# Patient Record
Sex: Male | Born: 1970 | Race: White | Hispanic: No | Marital: Married | Smoking: Never smoker
Health system: Southern US, Community
[De-identification: ages and names within clinical notes are randomized; demographics above are authoritative.]

## PROBLEM LIST (undated history)

## (undated) DIAGNOSIS — L989 Disorder of the skin and subcutaneous tissue, unspecified: Secondary | ICD-10-CM

## (undated) DIAGNOSIS — B019 Varicella without complication: Secondary | ICD-10-CM

## (undated) DIAGNOSIS — Z8619 Personal history of other infectious and parasitic diseases: Secondary | ICD-10-CM

## (undated) DIAGNOSIS — R55 Syncope and collapse: Secondary | ICD-10-CM

## (undated) DIAGNOSIS — E781 Pure hyperglyceridemia: Secondary | ICD-10-CM

## (undated) DIAGNOSIS — T7840XA Allergy, unspecified, initial encounter: Secondary | ICD-10-CM

## (undated) HISTORY — PX: SHOULDER ARTHROSCOPY W/ ROTATOR CUFF REPAIR: SHX2400

## (undated) HISTORY — DX: Varicella without complication: B01.9

## (undated) HISTORY — DX: Syncope and collapse: R55

## (undated) HISTORY — DX: Pure hyperglyceridemia: E78.1

## (undated) HISTORY — PX: CHOLECYSTECTOMY: SHX55

## (undated) HISTORY — DX: Personal history of other infectious and parasitic diseases: Z86.19

## (undated) HISTORY — PX: APPENDECTOMY: SHX54

## (undated) HISTORY — DX: Allergy, unspecified, initial encounter: T78.40XA

## (undated) HISTORY — DX: Disorder of the skin and subcutaneous tissue, unspecified: L98.9

## (undated) HISTORY — PX: EYE SURGERY: SHX253

---

## 2007-04-10 ENCOUNTER — Encounter: Admission: RE | Admit: 2007-04-10 | Discharge: 2007-04-10 | Payer: Self-pay | Admitting: Orthopedic Surgery

## 2007-06-02 ENCOUNTER — Encounter: Admission: RE | Admit: 2007-06-02 | Discharge: 2007-06-02 | Payer: Self-pay | Admitting: Internal Medicine

## 2010-12-30 ENCOUNTER — Encounter: Payer: Self-pay | Admitting: Orthopedic Surgery

## 2011-04-18 ENCOUNTER — Encounter: Payer: Self-pay | Admitting: Internal Medicine

## 2011-04-18 ENCOUNTER — Ambulatory Visit (INDEPENDENT_AMBULATORY_CARE_PROVIDER_SITE_OTHER): Payer: PRIVATE HEALTH INSURANCE | Admitting: Internal Medicine

## 2011-04-18 DIAGNOSIS — Z0000 Encounter for general adult medical examination without abnormal findings: Secondary | ICD-10-CM

## 2011-04-18 DIAGNOSIS — E785 Hyperlipidemia, unspecified: Secondary | ICD-10-CM

## 2011-04-18 NOTE — Progress Notes (Signed)
Subjective:    Patient ID: Samuel Bridges, male    DOB: [birth year], 40 y.o.   MRN: [id number]  HPI Mr. Samuel Bridges presents to establish for on-going continuity care. His chief concern is a history of elevated transaminase that would resolve as he reduced his heavy exercise program. It went up again with working out. He has had no liver disease previously and has no particular risk factors. He was consuming a very high protein diet, including protein supplements. He also had an elevated lipid panel that was uncharacteristic: total cholesterol of 281, triglycerides in the 400's. This was an unusual lab reading for him: he has no prior h/o elevated cholesterol.  Past Medical History  Diagnosis Date  . Chicken pox   . History of shingles   . Allergy   . Vaso vagal episode     had full cardiology evaluation for exertional symptoms: negative Echo, stress-test  . Skin lesions, generalized     close surveillance with family history of melanoma   Past Surgical History  Procedure Date  . Appendectomy     '97  . Cholecystectomy     '10 acute; laproscopic  . Shoulder arthroscopy w/ rotator cuff repair     right shoulde  2010  . Eye surgery     correction of stabismus - adjustable suture technique   Family History  Problem Relation Age of Onset  . Hypertension Mother   . Cancer Father     lung  . Basal cell carcinoma Father     aggressive - required radical neck dissection  . Melanoma Sister   . Basal cell carcinoma Sister   . Cancer Paternal Grandfather     lung cancer  . Diabetes Neg Hx   . Early death Neg Hx   . COPD Neg Hx   . Cancer Other     lung cancer   History   Social History  . Marital Status: Married    Spouse Name: N/A    Number of Children: 3  . Years of Education: 16   Occupational History  . businessman     Gen' mgr for [employer]   Social History Main Topics  . Smoking status: Never Smoker   . Smokeless tobacco: Never Used  . Alcohol Use: Yes  . Drug  Use: No  . Sexually Active: Yes -- Male partner(s)   Other Topics Concern  . Not on file   Social History Narrative   [employer] BS ac[employer]g; Married '02.  2 sons - '03, '06; 1 dtr - '09. 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 Marriage is in excellent health. Hobbies - adult baseball league - plays catcher; physical training       Review of Systems Review of Systems  Constitutional:  Negative for fever, chills, activity change and unexpected weight change.  HENT:  Negative for hearing loss, ear pain, congestion, neck stiffness and postnasal drip.   Eyes: Negative for pain, discharge and visual disturbance.  Respiratory: Negative for chest tightness and wheezing.   Cardiovascular: Negative for chest pain and palpitations.       [No decreased exercise tolerance Gastrointestinal: [No change in bowel habit. No bloating or gas. No reflux or indigestion Genitourinary: Negative for urgency, frequency, flank pain and difficulty urinating.  Musculoskeletal: Negative for myalgias, back pain, arthralgias and gait problem.  Neurological: Negative for dizziness, tremors, weakness and headaches.  Hematological: Negative for adenopathy.  Psychiatric/Behavioral: Negative for behavioral problems and dysphoric  mood.       Objective:   Physical Exam Constitutional: He is oriented to person, place, and time. He appears well-developed and well-nourished.       Healthy appearing white male in no acute distress  HENT:  Head: Normocephalic and atraumatic.  Right Ear: External ear normal. EAC/TM nl Left Ear: External ear normal.  EAC/TM nl Nose: Nose normal.  Mouth/Throat: Oropharynx is clear and moist.  Eyes: Conjunctivae and EOM are normal. Pupils are equal, round, and reactive to light. Right eye exhibits no discharge. Left eye exhibits no discharge. No scleral icterus.  Neck: Normal range of motion. Neck supple. No JVD present. No tracheal deviation present. No  thyromegaly present.  Cardiovascular: Normal rate, regular rhythm and normal heart sounds.  Exam reveals no gallop and no friction rub.   No murmur heard.      Quiet precordium. 2+ radial and DP pulses  Pulmonary/Chest: Effort normal. No respiratory distress. He has no wheezes. He has no rales. He exhibits no tenderness.       No chest wall deformity  Abdominal: Soft. Bowel sounds are normal. He exhibits no distension. There is no tenderness. There is no rebound and no guarding.       No heptosplenomegaly, no tenderness rightupper quadrant  Genitourinary: Deferred  Musculoskeletal: Normal range of motion. He exhibits no edema and no tenderness.       Small and large joints without redness, synovial thickening or deformity. Full range of motion preserved about all small, median and large joints.  Lymphadenopathy:    He has no cervical adenopathy.  Neurological: He is alert and oriented to person, place, and time. He has normal reflexes. No cranial nerve deficit. Coordination normal.  Skin: Skin is warm and dry. No rash noted. No erythema. Benign lesions on back but no suspicious lesions Psychiatric: He has a normal mood and affect. His behavior is normal. Thought content normal.         Assessment & Plan:  1. Elevated transaminases - LFT will fluctuate. He does not know if this is SGOT or SGPT. If the former, given the association with extreme training, it may be an elevation due to muscle inflammation. He does report that he has been a blood donor with normal iron levels arguing against hemachromatosis  Plan - U/S abdomen to asses hepatic architecture           Lab: LFTs, GGT, alpha-1-antitrypsin  2. Elevated lipids - he reports that his diet had been fat laden prior to the last study.  Plan - repeat Lipid panel with recommendations to follow.  3. Health maintenance - patient with benign history. PHysical exam is normal. Labs are pending. He is physically active and fit. His  immunizations are up to date.   In summary - a very nice man who appears medically stable. Will fully evaluated liver function and lipids. If all testing is normal he is asked to return on a as needed basis or 2 years.

## 2011-04-19 ENCOUNTER — Encounter: Payer: Self-pay | Admitting: Internal Medicine

## 2011-04-19 DIAGNOSIS — E785 Hyperlipidemia, unspecified: Secondary | ICD-10-CM | POA: Insufficient documentation

## 2011-04-24 ENCOUNTER — Telehealth: Payer: Self-pay | Admitting: Internal Medicine

## 2011-04-24 ENCOUNTER — Ambulatory Visit
Admission: RE | Admit: 2011-04-24 | Discharge: 2011-04-24 | Disposition: A | Discharge status: Home / Self Care | Payer: PRIVATE HEALTH INSURANCE | Source: Ambulatory Visit | Attending: Internal Medicine | Admitting: Internal Medicine

## 2011-04-24 NOTE — Telephone Encounter (Signed)
Patient informed. 

## 2011-04-24 NOTE — Telephone Encounter (Signed)
Please call pt: abdominal U/S 100% normal except somebody snuck off with the gallbladder. Thanks

## 2011-05-28 ENCOUNTER — Other Ambulatory Visit (INDEPENDENT_AMBULATORY_CARE_PROVIDER_SITE_OTHER): Payer: PRIVATE HEALTH INSURANCE

## 2011-05-28 ENCOUNTER — Other Ambulatory Visit: Payer: Self-pay | Admitting: Internal Medicine

## 2011-05-28 DIAGNOSIS — Z0000 Encounter for general adult medical examination without abnormal findings: Secondary | ICD-10-CM

## 2011-05-28 DIAGNOSIS — E785 Hyperlipidemia, unspecified: Secondary | ICD-10-CM

## 2011-05-28 LAB — LIPID PANEL: HDL: 45 mg/dL (ref >39.00)

## 2011-05-28 LAB — HEPATIC FUNCTION PANEL
ALT: 36 U/L (ref 0–53)
Alkaline Phosphatase: 84 U/L (ref 39–117)
Bilirubin, Direct: 0.2 mg/dL (ref 0.0–0.3)
Total Protein: 7 g/dL (ref 6.0–8.3)

## 2011-05-28 LAB — COMPREHENSIVE METABOLIC PANEL
AST: 29 U/L (ref 0–37)
Alkaline Phosphatase: 84 U/L (ref 39–117)
BUN: 12 mg/dL (ref 6–23)
Creatinine, Ser: 1.1 mg/dL (ref 0.4–1.5)
Total Bilirubin: 0.8 mg/dL (ref 0.3–1.2)

## 2011-05-30 ENCOUNTER — Encounter: Payer: Self-pay | Admitting: Internal Medicine

## 2011-08-08 ENCOUNTER — Other Ambulatory Visit (INDEPENDENT_AMBULATORY_CARE_PROVIDER_SITE_OTHER): Payer: PRIVATE HEALTH INSURANCE

## 2011-08-08 ENCOUNTER — Ambulatory Visit (INDEPENDENT_AMBULATORY_CARE_PROVIDER_SITE_OTHER): Payer: PRIVATE HEALTH INSURANCE | Admitting: Internal Medicine

## 2011-08-08 DIAGNOSIS — R202 Paresthesia of skin: Secondary | ICD-10-CM

## 2011-08-08 DIAGNOSIS — M2550 Pain in unspecified joint: Secondary | ICD-10-CM

## 2011-08-08 DIAGNOSIS — R209 Unspecified disturbances of skin sensation: Secondary | ICD-10-CM

## 2011-08-08 DIAGNOSIS — R232 Flushing: Secondary | ICD-10-CM

## 2011-08-08 LAB — T4, FREE: Free T4: 0.75 ng/dL (ref 0.60–1.60)

## 2011-08-08 LAB — COMPREHENSIVE METABOLIC PANEL
ALT: 54 U/L — ABNORMAL HIGH (ref 0–53)
AST: 33 U/L (ref 0–37)
Albumin: 4.2 g/dL (ref 3.5–5.2)
Alkaline Phosphatase: 100 U/L (ref 39–117)
BUN: 13 mg/dL (ref 6–23)
Calcium: 9.2 mg/dL (ref 8.4–10.5)
Chloride: 105 mEq/L (ref 96–112)
Potassium: 3.9 mEq/L (ref 3.5–5.1)
Sodium: 143 mEq/L (ref 135–145)
Total Protein: 6.9 g/dL (ref 6.0–8.3)

## 2011-08-08 LAB — HEPATIC FUNCTION PANEL
Albumin: 4.2 g/dL (ref 3.5–5.2)
Total Protein: 6.9 g/dL (ref 6.0–8.3)

## 2011-08-08 NOTE — Progress Notes (Signed)
Subjective:    Patient ID: Samuel Bridges, male    DOB: [birth year], 40 y.o.   MRN: [id number]  HPI Samuel Bridges presents w/ 3 week h/o paresthesias both hands and the left distal LE (minimal symptoms right LE). He has had joint pain at the MCP and PIP joints, no redness, minimal edema. Has a decrease in grip strength that is minimal. He also has been having hot flashes that is a rising sensation but persists for hours. He can't get cool. No tachycardia or SOB. Medium sized joint don't hurt but feel stiff. He denies any respiratory problems, chest pain, GI symptoms. He has not been tachycardic, no change in skin texture. He has not had "fight or flight" type sensation to suggest surge of catecholamines. He was previously feeling well.  Past Medical History  Diagnosis Date  . Chicken pox   . History of shingles   . Allergy   . Vaso vagal episode     had full cardiology evaluation for exertional symptoms: negative Echo, stress-test  . Skin lesions, generalized     close surveillance with family history of melanoma   Past Surgical History  Procedure Date  . Appendectomy     '97  . Cholecystectomy     '10 acute; laproscopic  . Shoulder arthroscopy w/ rotator cuff repair     right shoulde  2010  . Eye surgery     correction of stabismus - adjustable suture technique   Family History  Problem Relation Age of Onset  . Hypertension Mother   . Cancer Father     lung  . Basal cell carcinoma Father     aggressive - required radical neck dissection  . Melanoma Sister   . Basal cell carcinoma Sister   . Cancer Paternal Grandfather     lung cancer  . Diabetes Neg Hx   . Early death Neg Hx   . COPD Neg Hx   . Cancer Other     lung cancer   History   Social History  . Marital Status: Married    Spouse Name: N/A    Number of Children: 3  . Years of Education: 16   Occupational History  . businessman     Gen' mgr for [employer]   Social History Main Topics  . Smoking status:  Never Smoker   . Smokeless tobacco: Never Used  . Alcohol Use: Yes  . Drug Use: No  . Sexually Active: Yes -- Male partner(s)   Other Topics Concern  . Not on file   Social History Narrative   [employer] ac[employer]; Married '02.  2 sons - '03, '06; 1 dtr - '09. 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 Marriage is in excellent health. Hobbies - adult baseball league - plays catcher; physical training       Review of Systems Review of Systems  Constitutional:  Negative for fever, chills, activity change and unexpected weight change.  HEENT:  Negative for hearing loss, ear pain, congestion, neck stiffness and postnasal drip. Negative for sore throat or swallowing problems. Negative for dental complaints.   Eyes: Negative for vision loss or change in visual acuity.  Respiratory: Negative for chest tightness and wheezing.   Cardiovascular: Negative for chest pain and palpitation. No decreased exercise tolerance Gastrointestinal: No change in bowel habit. No bloating or gas. No reflux or indigestion Genitourinary: Negative for urgency, frequency, flank pain and difficulty urinating.  Musculoskeletal: Negative for myalgias,  back pain, arthralgias and gait problem.  Neurological: Negative for dizziness, tremors, weakness and headaches.  Hematological: Negative for adenopathy.  Psychiatric/Behavioral: Negative for behavioral problems and dysphoric mood.       Objective:   Physical Exam Vitals reviewed - stable, no tachycardia Gen'l - WNWD white man in no distress HEENT - C&S clear, no oral lesions Neck- supple, no thyromegaly , no thyroid nodules or tenderness Chest - clear to A&P, no increase WOB Cor- 2+ radial pulse, RRR, no murmurs Abdomen - BS+, soft, no HSM Ext - no deformity, no edema Derm - normal skin turgor, no tenting Neuro - A&O x 3, CN II-XII normal, MS no perceptible weakness, DTRs brisk without clonus, cerebellar - no increased tonicity, no  tremor, nl gait.          Assessment & Plan:  Paresthesias - patient with a combination of paresthesia, mild muscle weakness, joint pain with minimally abnormal physical exam, no sign of infection. Differential includes thyroid disease, possible adrenal dysfunction, connective tissue disease (lupus, myositis.).  Plan - multiple lab studies: thyroid functions, RF, ANA, ESR  Addendum - Bmet - normal, ALT mildly elevated at 54, ESR 8, ANA neg, RF <10, TSH 1.78, FT4 0.75                      B12 low at 203  Plan - B12 replacement           Blood for H.Pylori antibody

## 2011-08-09 LAB — ANA: Anti Nuclear Antibody(ANA): NEGATIVE

## 2011-08-09 LAB — RHEUMATOID FACTOR: Rhuematoid fact SerPl-aCnc: <10 IU/mL (ref <=14)

## 2011-08-16 ENCOUNTER — Telehealth: Payer: Self-pay | Admitting: *Deleted

## 2011-08-16 ENCOUNTER — Ambulatory Visit (INDEPENDENT_AMBULATORY_CARE_PROVIDER_SITE_OTHER): Payer: PRIVATE HEALTH INSURANCE | Admitting: *Deleted

## 2011-08-16 DIAGNOSIS — E538 Deficiency of other specified B group vitamins: Secondary | ICD-10-CM

## 2011-08-16 DIAGNOSIS — R232 Flushing: Secondary | ICD-10-CM

## 2011-08-16 MED ORDER — CYANOCOBALAMIN 1000 MCG/ML IJ SOLN
1000.0000 ug | Freq: Once | INTRAMUSCULAR | Status: AC
  Administered 2011-08-16: 1000 ug via INTRAMUSCULAR

## 2011-08-16 MED ORDER — CYANOCOBALAMIN 1000 MCG/ML IJ SOLN: 1000.0000 ug | Freq: Once | INTRAMUSCULAR | Status: DC

## 2011-08-16 MED ORDER — CYANOCOBALAMIN 1000 MCG/ML IJ SOLN: 1000.0000 ug | INTRAMUSCULAR | Status: DC

## 2011-08-16 NOTE — Progress Notes (Signed)
Explained self injection technique and sent in RX.

## 2011-08-16 NOTE — Telephone Encounter (Signed)
Patient informed, he will come in for nurse visit to learn to self-inject.

## 2011-08-16 NOTE — Patient Instructions (Addendum)
Inject 1 (one) mL of B-12 subcutaneously every 2 weeks x 4 injections then 1 (one) mL once a month x 6 injections. Return for labs in 8 months.

## 2011-08-16 NOTE — Telephone Encounter (Signed)
Patient requesting results of labs. I see OV notes, how often & for how long does he need b12 injections? Also, it says labs for H.pylori but no orders were entered. He needs to come in for additional labs?

## 2011-08-16 NOTE — Telephone Encounter (Signed)
All labs were normal except B12 low at 203.   Plan - B12 shots 1000 mcg q 2 weeks x 4 then monthly x 6 then repeat lab.            Serum h. Pylori ab ordered, also fasting AM cortisol-ordered  Next step if symptoms persist endocrine consult

## 2011-08-20 ENCOUNTER — Other Ambulatory Visit (INDEPENDENT_AMBULATORY_CARE_PROVIDER_SITE_OTHER): Payer: PRIVATE HEALTH INSURANCE

## 2011-08-20 ENCOUNTER — Telehealth: Payer: Self-pay | Admitting: *Deleted

## 2011-08-20 DIAGNOSIS — R1013 Epigastric pain: Secondary | ICD-10-CM

## 2011-08-20 DIAGNOSIS — E538 Deficiency of other specified B group vitamins: Secondary | ICD-10-CM

## 2011-08-20 DIAGNOSIS — R232 Flushing: Secondary | ICD-10-CM

## 2011-08-20 LAB — CORTISOL: Cortisol, Plasma: 7.6 ug/dL

## 2011-08-20 NOTE — Telephone Encounter (Signed)
Lab order needed

## 2011-08-21 ENCOUNTER — Telehealth: Payer: Self-pay | Admitting: *Deleted

## 2011-08-21 NOTE — Telephone Encounter (Signed)
Spoke w/patient. He c/o continued fatigue, ? Fever, body aches and a second "bite" on his neck. This second bite looks like first one that he had treated 6 weeks ago by UC who dx that it was a spider bite and gave him an antibiotic. This new "bite" looks the same but has no symptoms. I had advised him yesterday to keep it clean and dry and come in w/continued symptoms.   He is scheduled for OV tomorrow at 11:30, do you want pt to have any labs prior to OV?

## 2011-08-21 NOTE — Telephone Encounter (Signed)
No labs.  thanks

## 2011-08-22 ENCOUNTER — Ambulatory Visit (INDEPENDENT_AMBULATORY_CARE_PROVIDER_SITE_OTHER): Payer: PRIVATE HEALTH INSURANCE | Admitting: Internal Medicine

## 2011-08-22 VITALS — BP 120/80 | HR 67 | Temp 98.3°F | Wt 218.0 lb

## 2011-08-22 DIAGNOSIS — R232 Flushing: Secondary | ICD-10-CM

## 2011-08-22 LAB — HELICOBACTER PYLORI  ANTIBODY, IGM: Helicobacter pylori, IgM: 2.1 U/mL (ref <9.0)

## 2011-08-22 MED ORDER — AZITHROMYCIN 500 MG PO TABS: 500.0000 mg | ORAL_TABLET | Freq: Every day | ORAL | Status: AC

## 2011-08-22 MED ORDER — DOXYCYCLINE HYCLATE 100 MG PO TABS: 100.0000 mg | ORAL_TABLET | Freq: Two times a day (BID) | ORAL | Status: AC

## 2011-08-22 NOTE — Patient Instructions (Signed)
Neck wound - a bite? Plan - warm compresses two or three times a day; doxycycline 100 mg twice a day for 10 days to cover for skin infection, including MRSA.  Cough and respiratory symptoms - may be mycoplasma. Plan - Azithromycin 500 mg once a day for 3 days = 10- days antibiotic coverage; robitussin DM 1 tsp every 6 hours for the cough..  Flushing - will do 24 hour urine studies to rule out adrenal gland tumor = pheochromocytoma vs a carcinoid turmor. If these studies are normal and the flushing continues will need to refer to an endocrinologist.

## 2011-08-25 DIAGNOSIS — R232 Flushing: Secondary | ICD-10-CM | POA: Insufficient documentation

## 2011-08-25 NOTE — Assessment & Plan Note (Signed)
Patient with continued symptoms. He denies flight-flight feeling but will pursue w/u Pheo  Plan - 24 hr urine studies: catecholamines and metanephrines.

## 2011-08-25 NOTE — Progress Notes (Signed)
  Subjective:    Patient ID: Samuel Bridges, male    DOB: Sep 27, 1971, 40 y.o.   MRN: [id number]  HPI Samuel Bridges opresnets for follow-up. He is being evaluated for flusing and a sense of hyperthermia. Initial labs were unrevealing. He continues to have symptoms w/o change. No new c/o or pporblems.  I have reviewed the patient's medical history in detail and updated the computerized patient record.    Review of Systems System review is negative for any constitutional, cardiac, pulmonary, GI or neuro symptoms or complaints     Objective:   Physical Exam Vitals reviewed - normal BP and pulse Gen'l - WNWD white male in NAD Resp - normal CVor - RRRR       Assessment & Plan:

## 2011-08-26 ENCOUNTER — Other Ambulatory Visit: Payer: PRIVATE HEALTH INSURANCE

## 2011-08-26 DIAGNOSIS — R232 Flushing: Secondary | ICD-10-CM

## 2011-08-29 LAB — 5 HIAA, QUANTITATIVE, URINE, 24 HOUR: 5-HIAA, 24 Hr Urine: 3.3 mg/24 h (ref <=6.0)

## 2011-08-31 LAB — CATECHOLAMINES, FRACTIONATED, URINE, 24 HOUR
Creatinine, Urine mg/day-CATEUR: 2.19 g/(24.h) (ref 0.63–2.50)
Epinephrine, 24 hr Urine: 15 mcg/24 h (ref 2–24)
Total Volume - CF 24Hr U: 1650 mL

## 2011-09-01 LAB — METANEPHRINES, URINE, 24 HOUR
Metaneph Total, Ur: 386 mcg/24 h (ref 115–695)
Normetanephrine, 24H Ur: 288 mcg/24 h (ref 35–482)

## 2011-09-02 ENCOUNTER — Ambulatory Visit (INDEPENDENT_AMBULATORY_CARE_PROVIDER_SITE_OTHER): Payer: PRIVATE HEALTH INSURANCE | Admitting: Internal Medicine

## 2011-09-02 ENCOUNTER — Encounter: Payer: Self-pay | Admitting: Internal Medicine

## 2011-09-02 VITALS — BP 130/88 | HR 64 | Temp 98.0°F | Wt 216.0 lb

## 2011-09-02 DIAGNOSIS — R0789 Other chest pain: Secondary | ICD-10-CM

## 2011-09-02 DIAGNOSIS — R4182 Altered mental status, unspecified: Secondary | ICD-10-CM

## 2011-09-02 NOTE — Progress Notes (Signed)
Subjective:    Patient ID: Samuel Bridges, male    DOB: 02/16/1971, 40 y.o.   MRN: [id number]  HPI Mr. Filsinger has been being evaluated for symptoms of feeling hot intermittently and having flushing episodes. His evaluation has been negative: normal labs, normal 24 hr urine studies for catecholamines, metanephrines, 5HIAA, random AM cortisol. He presents today for follow-up. He reports that over the past 5-7 days he has had intermittent electric shock like sensations posterior occiput and posterior neck, he feels like an internal earthquake occurs periodically, he has generally felt bad, he reports that he has had difficulty expressing himself, his thinking has been slowed. He denies any headache, focal weakness, persistent paresthesia although he does report transient tingling in the extremities. He has had no gait disturbance or focal muscular weakness. He reports that not being able to clearly articulate his symptoms is also very disturbing to him. He does continue with B12 replacement.  Past Medical History  Diagnosis Date  . Chicken pox   . History of shingles   . Allergy   . Vaso vagal episode     had full cardiology evaluation for exertional symptoms: negative Echo, stress-test  . Skin lesions, generalized     close surveillance with family history of melanoma   Past Surgical History  Procedure Date  . Appendectomy     '97  . Cholecystectomy     '10 acute; laproscopic  . Shoulder arthroscopy w/ rotator cuff repair     right shoulde  2010  . Eye surgery     correction of stabismus - adjustable suture technique   Family History  Problem Relation Age of Onset  . Hypertension Mother   . Cancer Father     lung  . Basal cell carcinoma Father     aggressive - required radical neck dissection  . Melanoma Sister   . Basal cell carcinoma Sister   . Cancer Paternal Grandfather     lung cancer  . Diabetes Neg Hx   . Early death Neg Hx   . COPD Neg Hx   . Cancer Other     lung cancer    History   Social History  . Marital Status: Married    Spouse Name: N/A    Number of Children: 3  . Years of Education: 16   Occupational History  . businessman     Gen' mgr for [employer]   Social History Main Topics  . Smoking status: Never Smoker   . Smokeless tobacco: Never Used  . Alcohol Use: Yes  . Drug Use: No  . Sexually Active: Yes -- Male partner(s)   Other Topics Concern  . Not on file   Social History Narrative   [employer] BS ac[employer]; Married '02.  2 sons - '03, '06; 1 dtr - '09. 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 Marriage is in excellent health. Hobbies - adult baseball league - plays catcher; physical training       Review of Systems Review of Systems  Constitutional:  Negative for fever, chills, activity change and unexpected weight change.  HEENT:  Negative for hearing loss, ear pain, congestion,  and postnasal drip. Negative for sore throat or swallowing problems. Negative for dental complaints.   Eyes: Negative for vision loss or change in visual acuity.  Respiratory: Negative for chest tightness and wheezing.   Cardiovascular: Negative for chest pain and palpitation. No decreased exercise tolerance Gastrointestinal: No change in bowel  habit. No bloating or gas. No reflux or indigestion Genitourinary: Negative for urgency, frequency, flank pain and difficulty urinating.  Musculoskeletal: Negative for myalgias, back pain, arthralgias and gait problem.  Neurological: Negative for dizziness, tremors, weakness and headaches.  Hematological: Negative for adenopathy.  Psychiatric/Behavioral: Negative for behavioral problems and dysphoric mood.       Objective:   Physical Exam Vitals noted - stable Gen'l - WNWD white man in no acute distress but uncomfortable and not himself HEENT- [place]?AT, C&S clear, neck is supple Nodes - no adenopathy Chest - CTAP Cor - 2+ radial pulses, RRR Abdomen =- BS+, no guarding or  rebound Neuro - oriented to person, place, context. Speech is clear. Cognition - normal but slower than at previous exams. CN II-XII - normal facial symmetry and movement, PERRLA, EOMI, fundi difficult to visualize but no gross papilledema, no vascular abnormality. MS - 5/5 throughout. Cerebellar - no tremor, normal gait.        Assessment & Plan:

## 2011-09-03 ENCOUNTER — Telehealth: Payer: Self-pay | Admitting: *Deleted

## 2011-09-03 DIAGNOSIS — R4182 Altered mental status, unspecified: Secondary | ICD-10-CM | POA: Insufficient documentation

## 2011-09-03 MED ORDER — DIAZEPAM 10 MG PO TABS: ORAL_TABLET | ORAL | Status: DC

## 2011-09-03 NOTE — Telephone Encounter (Signed)
OK per MD Valium 10 mg #2, called into pharm,Patient informed

## 2011-09-03 NOTE — Assessment & Plan Note (Signed)
Patient has been evaluated for flushing and sensation of thermoregulation problems with no etiology to date. He is now presenting with new/progressive symptoms of "electrical" discomfort in lancinating fashion intermittently at the posterior cervical and occipital region, difficulties expressing himself, cognitive slowing. Concern is for CNS process. Discussed with Dr. Modesto Charon.  Plan - MRI brain           EEG           Neurology consult - Friday, September 28th @ 8:30 AM

## 2011-09-03 NOTE — Telephone Encounter (Signed)
Patient requesting Valium prior to MRI scheduled for tomorrow am.

## 2011-09-04 ENCOUNTER — Ambulatory Visit
Admission: RE | Admit: 2011-09-04 | Discharge: 2011-09-04 | Disposition: A | Discharge status: Home / Self Care | Payer: PRIVATE HEALTH INSURANCE | Source: Ambulatory Visit | Attending: Internal Medicine | Admitting: Internal Medicine

## 2011-09-04 ENCOUNTER — Telehealth: Payer: Self-pay | Admitting: *Deleted

## 2011-09-04 DIAGNOSIS — R4182 Altered mental status, unspecified: Secondary | ICD-10-CM

## 2011-09-04 MED ORDER — GADOBENATE DIMEGLUMINE 529 MG/ML IV SOLN
20.0000 mL | Freq: Once | INTRAVENOUS | Status: AC | PRN
  Administered 2011-09-04: 20 mL via INTRAVENOUS

## 2011-09-04 NOTE — Telephone Encounter (Signed)
Patient requesting results of MRI when avail.

## 2011-09-05 NOTE — Telephone Encounter (Signed)
Normal study. Subtle white matter changes that may be associated with migraine. Keep appointment with Dr. Modesto Charon

## 2011-09-05 NOTE — Telephone Encounter (Signed)
Patient informed. 

## 2011-09-06 ENCOUNTER — Ambulatory Visit (INDEPENDENT_AMBULATORY_CARE_PROVIDER_SITE_OTHER): Payer: PRIVATE HEALTH INSURANCE | Admitting: Neurology

## 2011-09-06 ENCOUNTER — Encounter: Payer: Self-pay | Admitting: Neurology

## 2011-09-06 VITALS — BP 110/80 | HR 68 | Ht 75.0 in | Wt 215.0 lb

## 2011-09-06 DIAGNOSIS — G43909 Migraine, unspecified, not intractable, without status migrainosus: Secondary | ICD-10-CM

## 2011-09-06 NOTE — Patient Instructions (Signed)
Your MRA's have been scheduled for Wednesday, Oct. 3rd at 7:00pm.  Please arrive to [facility] by 6:45pm.
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
## 2011-09-10 NOTE — Progress Notes (Signed)
Thanks for the positive feedback!  Best,  matt

## 2011-09-11 ENCOUNTER — Ambulatory Visit (HOSPITAL_COMMUNITY)
Admission: RE | Admit: 2011-09-11 | Discharge: 2011-09-11 | Disposition: A | Discharge status: Home / Self Care | Payer: PRIVATE HEALTH INSURANCE | Source: Ambulatory Visit | Attending: Neurology | Admitting: Neurology

## 2011-09-11 DIAGNOSIS — I672 Cerebral atherosclerosis: Secondary | ICD-10-CM | POA: Insufficient documentation

## 2011-09-11 DIAGNOSIS — R42 Dizziness and giddiness: Secondary | ICD-10-CM | POA: Insufficient documentation

## 2011-09-11 DIAGNOSIS — G43909 Migraine, unspecified, not intractable, without status migrainosus: Secondary | ICD-10-CM

## 2011-09-11 DIAGNOSIS — R259 Unspecified abnormal involuntary movements: Secondary | ICD-10-CM | POA: Insufficient documentation

## 2011-09-11 MED ORDER — GADOBENATE DIMEGLUMINE 529 MG/ML IV SOLN
20.0000 mL | Freq: Once | INTRAVENOUS | Status: AC | PRN
  Administered 2011-09-11: 20 mL via INTRAVENOUS

## 2011-09-12 ENCOUNTER — Ambulatory Visit (HOSPITAL_COMMUNITY)
Admission: RE | Admit: 2011-09-12 | Discharge: 2011-09-12 | Disposition: A | Discharge status: Home / Self Care | Payer: PRIVATE HEALTH INSURANCE | Source: Ambulatory Visit | Attending: Internal Medicine | Admitting: Internal Medicine

## 2011-09-12 DIAGNOSIS — F29 Unspecified psychosis not due to a substance or known physiological condition: Secondary | ICD-10-CM | POA: Insufficient documentation

## 2011-09-12 DIAGNOSIS — R42 Dizziness and giddiness: Secondary | ICD-10-CM | POA: Insufficient documentation

## 2011-09-12 NOTE — Procedures (Signed)
EEG NUMBER:  REFERRING PHYSICIAN:  Rosalyn Gess. Norins, MD  HISTORY:  A 40 year old male with episodes of confusion and dizziness.  MEDICATIONS:  None.  CONDITIONS OF RECORDING:  This is a 16-channel EEG carried out with patient in the awake, drowsy, and asleep states.  DESCRIPTION:  The waking background activity consists of a low-voltage symmetrical fairly well-organized 10 Hz alpha activity seen from the parieto-occipital and posterotemporal regions.  Low-voltage fast activity poorly organized was seen anteriorly at times superimposed on more posterior rhythms.  A mixture of theta and alpha was seen from the central and temporal regions.  The patient drowses with slowing to irregular which is theta and beta activity.  The patient goes into a light sleep with symmetrical sleep spindles and vertex with a sharp activity and irregular slow activity.  Hypoventilation was performed and elicited a mild build up, but failed to elicit any abnormalities. Intermittent photic stimulation was performed as well and again failed to elicit any abnormalities.  IMPRESSION:  This is a normal EEG.          ______________________________ Thana Farr, MD    ZO:[id number] D:  09/12/2011 19:02:34  T:  09/12/2011 20:51:33  Job #:  [id number]

## 2011-09-16 ENCOUNTER — Encounter: Payer: Self-pay | Admitting: Neurology

## 2011-09-20 ENCOUNTER — Telehealth: Payer: Self-pay | Admitting: Neurology

## 2011-09-20 MED ORDER — VENLAFAXINE HCL 37.5 MG PO TABS: ORAL_TABLET | ORAL | Status: DC

## 2011-09-20 NOTE — Telephone Encounter (Signed)
Called in the venlafaxine as prescribed by Dr. Modesto Charon to the [facility] [place] and pt aware.

## 2011-09-20 NOTE — Telephone Encounter (Signed)
Tiffany -- please call into his pharmacy venlafaxine XR 37.5mg  tabs take 1 tab for 3 weeks 1am then increase to two tabs qam from then on.  60 tabs, 3 refills.  thx.

## 2011-09-20 NOTE — Telephone Encounter (Signed)
Pt would like a prescription for his migraines, as discussed previously. Can we call one into the [facility] in [place]?

## 2011-10-23 ENCOUNTER — Encounter: Payer: Self-pay | Admitting: Neurology

## 2011-10-23 ENCOUNTER — Ambulatory Visit (INDEPENDENT_AMBULATORY_CARE_PROVIDER_SITE_OTHER): Payer: PRIVATE HEALTH INSURANCE | Admitting: Neurology

## 2011-10-23 VITALS — BP 112/78 | HR 72 | Wt 220.0 lb

## 2011-10-23 DIAGNOSIS — G609 Hereditary and idiopathic neuropathy, unspecified: Secondary | ICD-10-CM

## 2011-10-23 MED ORDER — AMITRIPTYLINE HCL 25 MG PO TABS: ORAL_TABLET | ORAL | Status: DC

## 2011-10-23 NOTE — Patient Instructions (Signed)
We will call you with your appointment for the nerve conduction studies.  They will be done at  [facility] [address] [place] [phone].

## 2011-10-23 NOTE — Progress Notes (Signed)
Dear Dr. Debby Bud,  I saw  Samuel Bridges back in [facility] for his problem with headaches and numbness and tingling in his hands and feet.  As you may recall, he is a 40 y.o. year old male with a history of B12 deficiency who had headaches with transient spells of difficulty speaking that I felt to be migraines.  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  He has not had any further spells of difficult speaking, but is getting dull headaches every day that have worsened over the last few days.  He is taking Aleve about twice per week for his headaches with little relief.  He also is getting intermittent tingling and pain in his feet and hands that are not directly related to the headaches.  I tried to start him on Effexor for anxiety as well as headache prevention and he felt that this made things worse.  He continues to get B12 shots and is wondering because he switched to a Q1 month schedule that that is why he has had an interval worsening in his headaches and numbness and tingling.   Medical history, social history, family history, medications and allergies were reviewed and have not changed since the last clinic vist.  ROS:  13 systems were reviewed and are notable for anxiety.  All other review of systems are unremarkable.  Exam: . Filed Vitals:   10/23/11 1101  BP: 112/78  Pulse: 72  Weight: 220 lb (99.791 kg)    In general, well appearing man.  Mental status:   The patient is oriented to person, place and time. Recent and remote memory are intact. Attention span and concentration are normal. Language including repetition, naming, following commands are intact. Fund of knowledge of current and historical events, as well as vocabulary are normal.  Cranial Nerves: Pupils  are equally round and reactive to light. Visual fields full to confrontation. Extraocular movements are intact without nystagmus. Facial sensation and muscles of mastication are intact. Muscles of facial expression are symmetric. Hearing intact to bilateral finger rub. Tongue protrusion, uvula, palate midline.  Shoulder shrug intact  Motor:  Normal bulk and tone, no drift and 5/5 muscle strength bilaterally.  Reflexes:  2+ thoughout, toes down.  Coordination:  Normal finger to nose  Sensation: decreased distally to temperature in fingers and hands, ?vibration loss in feet.  Gait:  Normal gait and station.  Romberg negative.  Impression/Recs:  I think many of his symptoms may be due to B12 deficiency.  I am going to get a NCS to see if he indeed has a peripheral neuropathy that would corroborate that.  I am not sure how his headaches are related, but a trial of Elavil at night makes sense.  He is going to see if the B12 shot improves his headaches and then will try the Elavil if necessary at night.  We will see the patient back in 3 months.  Lupita Raider Modesto Charon, MD [facility], [place]

## 2011-11-19 ENCOUNTER — Telehealth: Payer: Self-pay | Admitting: Neurology

## 2011-11-19 NOTE — Telephone Encounter (Signed)
Left a message for the patient to call.

## 2011-11-19 NOTE — Telephone Encounter (Signed)
The patient called to confirm receipt of my message. No other issues.

## 2011-11-19 NOTE — Telephone Encounter (Signed)
Left the patient a message stating his NCS was normal. I asked that he please call the office and let us know that he received the message.

## 2011-11-19 NOTE — Telephone Encounter (Signed)
Message copied by Benay Spice on Tue Nov 19, 2011  1:15 PM ------      Message from: Milas Gain      Created: Tue Nov 19, 2011  9:25 AM       Hi Jan,            Could you let Mr. Mohs know that his NCS was normal.            Thanks,            [employer]

## 2011-11-22 ENCOUNTER — Encounter: Payer: Self-pay | Admitting: Neurology

## 2011-12-12 ENCOUNTER — Ambulatory Visit: Payer: PRIVATE HEALTH INSURANCE | Admitting: Neurology

## 2013-10-20 ENCOUNTER — Telehealth: Payer: Self-pay | Admitting: Internal Medicine

## 2013-10-20 NOTE — Telephone Encounter (Signed)
Ok for CPX appointment but it may be a while.

## 2013-10-20 NOTE — Telephone Encounter (Signed)
Is it okay to schedule an appointment for this patient he hasnt been seen here since 09/02/11 Patient would like to have a physical done  Please advise if this can be done

## 2013-10-27 ENCOUNTER — Ambulatory Visit (INDEPENDENT_AMBULATORY_CARE_PROVIDER_SITE_OTHER): Payer: Commercial Managed Care - PPO | Admitting: Internal Medicine

## 2013-10-27 ENCOUNTER — Encounter: Payer: Self-pay | Admitting: Internal Medicine

## 2013-10-27 ENCOUNTER — Other Ambulatory Visit (INDEPENDENT_AMBULATORY_CARE_PROVIDER_SITE_OTHER): Payer: Commercial Managed Care - PPO

## 2013-10-27 VITALS — BP 136/90 | HR 61 | Temp 97.8°F | Ht 75.0 in | Wt 212.0 lb

## 2013-10-27 DIAGNOSIS — E785 Hyperlipidemia, unspecified: Secondary | ICD-10-CM

## 2013-10-27 DIAGNOSIS — Z23 Encounter for immunization: Secondary | ICD-10-CM

## 2013-10-27 DIAGNOSIS — Z0000 Encounter for general adult medical examination without abnormal findings: Secondary | ICD-10-CM

## 2013-10-27 LAB — COMPREHENSIVE METABOLIC PANEL
ALT: 85 U/L — ABNORMAL HIGH (ref 0–53)
AST: 42 U/L — ABNORMAL HIGH (ref 0–37)
Alkaline Phosphatase: 111 U/L (ref 39–117)
BUN: 10 mg/dL (ref 6–23)
Calcium: 9.3 mg/dL (ref 8.4–10.5)
Chloride: 106 mEq/L (ref 96–112)
Creatinine, Ser: 1 mg/dL (ref 0.4–1.5)
Potassium: 4.6 mEq/L (ref 3.5–5.1)

## 2013-10-27 LAB — HEPATIC FUNCTION PANEL
ALT: 85 U/L — ABNORMAL HIGH (ref 0–53)
Albumin: 4.1 g/dL (ref 3.5–5.2)
Alkaline Phosphatase: 111 U/L (ref 39–117)
Total Protein: 7.1 g/dL (ref 6.0–8.3)

## 2013-10-27 LAB — LIPID PANEL
HDL: 42 mg/dL (ref >39.00)
LDL Cholesterol: 96 mg/dL (ref 0–99)
Total CHOL/HDL Ratio: 4
Triglycerides: 197 mg/dL — ABNORMAL HIGH (ref 0.0–149.0)

## 2013-10-27 NOTE — Assessment & Plan Note (Signed)
INterval history is benign. Physical exam is normal. Lab results are in normal range including liver functions and cholesterol levels.  In summary A very nice man who is medically fit. Follow up general exam in 12-18 months.

## 2013-10-27 NOTE — Progress Notes (Signed)
Subjective:    Patient ID: Samuel Bridges, male    DOB: 1971-06-17, 42 y.o.   MRN: [id number]  HPI Mr. Soy presents for routine general medical exam. He has been well and feeling well. In the interval no major illness, no surgery and no injury.   Past Medical History  Diagnosis Date  . Chicken pox   . History of shingles   . Allergy   . Vaso vagal episode     had full cardiology evaluation for exertional symptoms: negative Echo, stress-test  . Skin lesions, generalized     close surveillance with family history of melanoma   Past Surgical History  Procedure Laterality Date  . Appendectomy      '97  . Cholecystectomy      '10 acute; laproscopic  . Shoulder arthroscopy w/ rotator cuff repair      right shoulde  2010  . Eye surgery      correction of stabismus - adjustable suture technique   Family History  Problem Relation Age of Onset  . Hypertension Mother   . Cancer Father     lung  . Basal cell carcinoma Father     aggressive - required radical neck dissection  . Melanoma Sister   . Basal cell carcinoma Sister   . Cancer Paternal Grandfather     lung cancer  . Diabetes Neg Hx   . Early death Neg Hx   . COPD Neg Hx   . Cancer Other     lung cancer   History   Social History  . Marital Status: Married    Spouse Name: N/A    Number of Children: 3  . Years of Education: 16   Occupational History  . businessman     Gen' mgr for [employer]   Social History Main Topics  . Smoking status: Never Smoker   . Smokeless tobacco: Never Used  . Alcohol Use: Yes  . Drug Use: No  . Sexual Activity: Yes    Partners: Female   Other Topics Concern  . Not on file   Social History Narrative   [employer] BS acctg; Married '02.  2 sons - '03, '06; 1 dtr - '09. 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 Marriage is in excellent health. Hobbies - adult baseball league - plays catcher; physical training       Review of  Systems Constitutional:  Negative for fever, chills, activity change and unexpected weight change.  HEENT:  Negative for hearing loss, ear pain, congestion, neck stiffness and postnasal drip. Negative for sore throat or swallowing problems. Negative for dental complaints.   Eyes: Negative for vision loss or change in visual acuity.  Respiratory: Negative for chest tightness and wheezing. Negative for DOE.   Cardiovascular: Negative for chest pain or palpitations. No decreased exercise tolerance Gastrointestinal: No change in bowel habit. No bloating or gas. No reflux or indigestion Genitourinary: Negative for urgency, frequency, flank pain and difficulty urinating.  Musculoskeletal: Negative for myalgias, back pain, arthralgias and gait problem.  Neurological: Negative for dizziness, tremors, weakness and headaches.  Hematological: Negative for adenopathy.  Psychiatric/Behavioral: Negative for behavioral problems and dysphoric mood.       Objective:   Physical Exam Filed Vitals:   10/27/13 0853  BP: 136/90  Pulse: 61  Temp: 97.8 F (36.6 C)   Wt Readings from Last 3 Encounters:  10/27/13 212 lb (96.163 kg)  10/23/11 220 lb (99.791 kg)  09/06/11  215 lb (97.523 kg)   Gen'l: Well nourished well developed athletic appearing male in no acute distress  HEENT: Head: Normocephalic and atraumatic. Right Ear: External ear normal. EAC/TM nl. Left Ear: External ear normal.  EAC/TM nl. Nose: Nose normal. Mouth/Throat: Oropharynx is clear and moist. Dentition - native, in good repair. No buccal or palatal lesions. Posterior pharynx clear. Eyes: Conjunctivae and sclera clear. EOM intact. Pupils are equal, round, and reactive to light. Right eye exhibits no discharge. Left eye exhibits no discharge. Neck: Normal range of motion. Neck supple. No JVD present. No tracheal deviation present. No thyromegaly present.  Cardiovascular: Normal rate, regular rhythm, no gallop, no friction rub, no murmur  heard.      Quiet precordium. 2+ radial and DP pulses . No carotid bruits Pulmonary/Chest: Effort normal. No respiratory distress or increased WOB, no wheezes, no rales. No chest wall deformity or CVAT. Abdomen: Soft. Bowel sounds are normal in all quadrants. He exhibits no distension, no tenderness, no rebound or guarding, No heptosplenomegaly  Genitourinary: deferred   Musculoskeletal: Normal range of motion. He exhibits no edema and no tenderness.       Small and large joints without redness, synovial thickening or deformity. Full range of motion preserved about all small, median and large joints.  Lymphadenopathy:    He has no cervical or supraclavicular adenopathy.  Neurological: He is alert and oriented to person, place, and time. CN II-XII intact. DTRs 2+ and symmetrical biceps, radial and patellar tendons. Cerebellar function normal with no tremor, rigidity, normal gait and station.  Skin: Skin is warm and dry. No rash noted. No erythema.  Psychiatric: He has a normal mood and affect. His behavior is normal. Thought content normal.   Recent Results (from the past 2160 hour(s))  HEPATIC FUNCTION PANEL     Status: Abnormal   Collection Time    10/27/13  9:39 AM      Result Value Range   Total Bilirubin 0.8  0.3 - 1.2 mg/dL   Bilirubin, Direct 0.0  0.0 - 0.3 mg/dL   Alkaline Phosphatase 111  39 - 117 U/L   AST 42 (*) 0 - 37 U/L   ALT 85 (*) 0 - 53 U/L   Total Protein 7.1  6.0 - 8.3 g/dL   Albumin 4.1  3.5 - 5.2 g/dL  COMPREHENSIVE METABOLIC PANEL     Status: Abnormal   Collection Time    10/27/13  9:39 AM      Result Value Range   Sodium 139  135 - 145 mEq/L   Potassium 4.6  3.5 - 5.1 mEq/L   Chloride 106  96 - 112 mEq/L   CO2 30  19 - 32 mEq/L   Glucose, Bld 96  70 - 99 mg/dL   BUN 10  6 - 23 mg/dL   Creatinine, Ser 1.0  0.4 - 1.5 mg/dL   Total Bilirubin 0.8  0.3 - 1.2 mg/dL   Alkaline Phosphatase 111  39 - 117 U/L   AST 42 (*) 0 - 37 U/L   ALT 85 (*) 0 - 53 U/L   Total  Protein 7.1  6.0 - 8.3 g/dL   Albumin 4.1  3.5 - 5.2 g/dL   Calcium 9.3  8.4 - 16.1 mg/dL   GFR 09.60  >45.40 mL/min  LIPID PANEL     Status: Abnormal   Collection Time    10/27/13  9:39 AM      Result Value Range   Cholesterol 177  0 - 200 mg/dL   Comment: ATP III Classification       Desirable:  < 200 mg/dL               Borderline High:  200 - 239 mg/dL          High:  > = 161 mg/dL   Triglycerides 096.0 (*) 0.0 - 149.0 mg/dL   Comment: Normal:  <454 mg/dLBorderline High:  150 - 199 mg/dL   HDL 09.81  >19.14 mg/dL   VLDL 78.2  0.0 - 95.6 mg/dL   LDL Cholesterol 96  0 - 99 mg/dL   Total CHOL/HDL Ratio 4     Comment:                Men          Women1/2 Average Risk     3.4          3.3Average Risk          5.0          4.42X Average Risk          9.6          7.13X Average Risk          15.0          11.0                             Assessment & Plan:

## 2013-10-27 NOTE — Patient Instructions (Signed)
Very good to see you. We can all celebrate the continued recovery of the economy  Your exam is normal - you look fit. However, you need to make time, like a job, for physical conditioning.  Lab today and results will be posted to [employer] - Tdap today good for 10 years.  Have a great Holiday season.

## 2013-10-27 NOTE — Progress Notes (Signed)
Pre visit review using our clinic review tool, if applicable. No additional management support is needed unless otherwise documented below in the visit note. 

## 2013-10-28 ENCOUNTER — Other Ambulatory Visit: Payer: Self-pay | Admitting: Internal Medicine

## 2014-01-25 ENCOUNTER — Ambulatory Visit: Payer: Commercial Managed Care - PPO | Admitting: Internal Medicine

## 2014-01-26 ENCOUNTER — Encounter: Payer: Self-pay | Admitting: Internal Medicine

## 2014-01-26 ENCOUNTER — Other Ambulatory Visit (INDEPENDENT_AMBULATORY_CARE_PROVIDER_SITE_OTHER): Payer: Commercial Managed Care - PPO

## 2014-01-26 ENCOUNTER — Ambulatory Visit (INDEPENDENT_AMBULATORY_CARE_PROVIDER_SITE_OTHER): Payer: Commercial Managed Care - PPO | Admitting: Internal Medicine

## 2014-01-26 VITALS — BP 120/90 | HR 64 | Temp 99.1°F | Wt 216.0 lb

## 2014-01-26 DIAGNOSIS — R7989 Other specified abnormal findings of blood chemistry: Secondary | ICD-10-CM

## 2014-01-26 DIAGNOSIS — R1013 Epigastric pain: Secondary | ICD-10-CM

## 2014-01-26 DIAGNOSIS — R945 Abnormal results of liver function studies: Secondary | ICD-10-CM

## 2014-01-26 LAB — HEPATIC FUNCTION PANEL
ALBUMIN: 4.1 g/dL (ref 3.5–5.2)
ALT: 40 U/L (ref 0–53)
AST: 26 U/L (ref 0–37)
Alkaline Phosphatase: 81 U/L (ref 39–117)
BILIRUBIN TOTAL: 0.6 mg/dL (ref 0.3–1.2)
Bilirubin, Direct: 0 mg/dL (ref 0.0–0.3)
Total Protein: 7.2 g/dL (ref 6.0–8.3)

## 2014-01-26 LAB — LIPASE: Lipase: 35 U/L (ref 11.0–59.0)

## 2014-01-26 LAB — AMYLASE: AMYLASE: 103 U/L (ref 27–131)

## 2014-01-26 NOTE — Patient Instructions (Signed)
Thanks for coming in.  Your pain is suggestive of pancreatitis with the location, severity and radiation to the back. It is possible to have stones in the common bile duct coming from the liver even after the gallbladder is removed.  Plan Lab work: liver function and pancreatic function to rule out pancreatitis  CT abdomen with contrast to also rule out any pancreatic disease  nexium 40 mg every AM before breakfast in case this is a stomach acid related problem  All results will be posted to MyChart.

## 2014-01-26 NOTE — Progress Notes (Signed)
Pre visit review using our clinic review tool, if applicable. No additional management support is needed unless otherwise documented below in the visit note. 

## 2014-01-26 NOTE — Progress Notes (Signed)
Subjective:    Patient ID: Samuel Bridges, male    DOB: 08/27/1971, 43 y.o.   MRN: [id number]  HPI Had a flu like illness [place]. He has had a cough since then although it is getting better. He is having a lot of abdominal cramping with pain with radiation to the mid-back. He has also had increased eructation. No weight. Some relief with eating but symptoms resume soon after meals. Bowel movements are normal. He tried dexilant x 2 doses with no relief. He has a h/o Cholecystectomy and had a report of stone in the liver at the time of surgery.  Past Medical History  Diagnosis Date  . Chicken pox   . History of shingles   . Allergy   . Vaso vagal episode     had full cardiology evaluation for exertional symptoms: negative Echo, stress-test  . Skin lesions, generalized     close surveillance with family history of melanoma   Past Surgical History  Procedure Laterality Date  . Appendectomy      '97  . Cholecystectomy      '10 acute; laproscopic  . Shoulder arthroscopy w/ rotator cuff repair      right shoulde  2010  . Eye surgery      correction of stabismus - adjustable suture technique   Family History  Problem Relation Age of Onset  . Hypertension Mother   . Cancer Father     lung  . Basal cell carcinoma Father     aggressive - required radical neck dissection  . Melanoma Sister   . Basal cell carcinoma Sister   . Cancer Paternal Grandfather     lung cancer  . Diabetes Neg Hx   . Early death Neg Hx   . COPD Neg Hx   . Cancer Other     lung cancer   History   Social History  . Marital Status: Married    Spouse Name: N/A    Number of Children: 3  . Years of Education: 16   Occupational History  . businessman     Gen' mgr for [employer]   Social History Main Topics  . Smoking status: Never Smoker   . Smokeless tobacco: Never Used  . Alcohol Use: Yes  . Drug Use: No  . Sexual Activity: Yes    Partners: Female   Other Topics Concern  . Not on  file   Social History Narrative   [employer] BS acctg; Married '02.  2 sons - '03, '06; 1 dtr - '09. 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 No history of abuse. Marriage is in excellent health. 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   No current facility-administered medications on file prior to visit.      Review of Systems System review is negative for any constitutional, cardiac, pulmonary, GI or neuro symptoms or complaints other than as described in the HPI.     Objective:   Physical Exam Filed Vitals:   01/26/14 1628  BP: 120/90  Pulse: 64  Temp: 99.1 F (37.3 C)   Wt Readings from Last 3 Encounters:  01/26/14 216 lb (97.977 kg)  10/27/13 212 lb (96.163 kg)  10/23/11 220 lb (99.791 kg)   Gen'l- WNWD man in no distress Cor - RRR Pulm - normal respirations Abd - BS+ x 4, very tender in the epigastrium and the duodenal region.  No guarding or rebound tenderness. No HSM Neuro - alert and oriented.       Assessment & Plan:  Epigastric abdominal pain - with location and radiation to mid back concern is for pancreatitis vs severe gastritis. There is a h/o lap chole.  Plan nexium 40 mg qAM  Lab: LFTs, amylase, lipase  Imaging - CT abdomen with contrast  Addendum: labs normal.

## 2014-01-31 ENCOUNTER — Ambulatory Visit (INDEPENDENT_AMBULATORY_CARE_PROVIDER_SITE_OTHER)
Admission: RE | Admit: 2014-01-31 | Discharge: 2014-01-31 | Disposition: A | Discharge status: Home / Self Care | Payer: Commercial Managed Care - PPO | Source: Ambulatory Visit | Attending: Internal Medicine | Admitting: Internal Medicine

## 2014-01-31 DIAGNOSIS — R1013 Epigastric pain: Secondary | ICD-10-CM

## 2014-01-31 MED ORDER — IOHEXOL 300 MG/ML  SOLN
100.0000 mL | Freq: Once | INTRAMUSCULAR | Status: AC | PRN
  Administered 2014-01-31: 100 mL via INTRAVENOUS

## 2014-09-13 IMAGING — CT CT ABDOMEN W/ CM
2 of 5 series · 17 of 46 positions shown, 19 images · IV contrast (omnipaque)
Comparison: CT-ABDOMEN AND PELVIS W/O CONTRAST dated 04/14/2009

CLINICAL DATA: Epigastric pain and nausea.

EXAM:
CT ABDOMEN WITH CONTRAST
TECHNIQUE: Multidetector CT imaging of the abdomen was performed using the
standard protocol following bolus administration of intravenous
contrast.
CONTRAST:  100mL OMNIPAQUE IOHEXOL 300 MG/ML  SOLN

[Series 2: abd/ pel 5mm · axial · 0.70mm/px · z∈[-389,-129]mm · 14 of 60 slices shown, 16 images]
[im 4/60  soft-tissue]
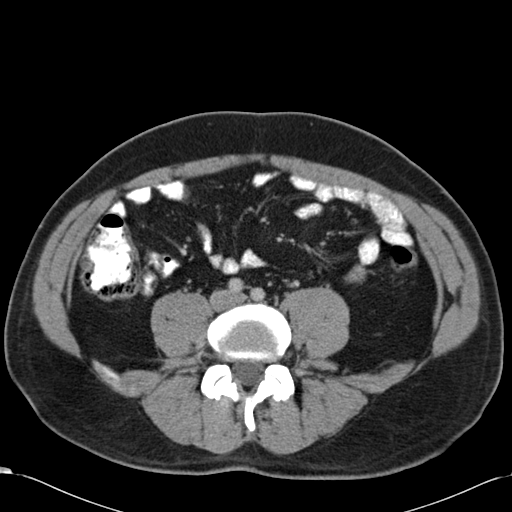
[im 4/60  bone]
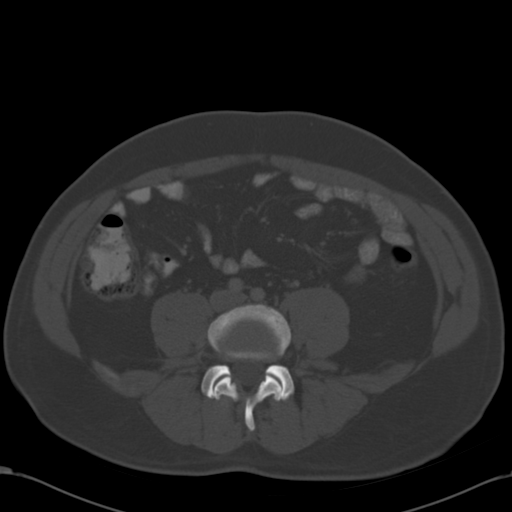
[im 8/60  soft-tissue]
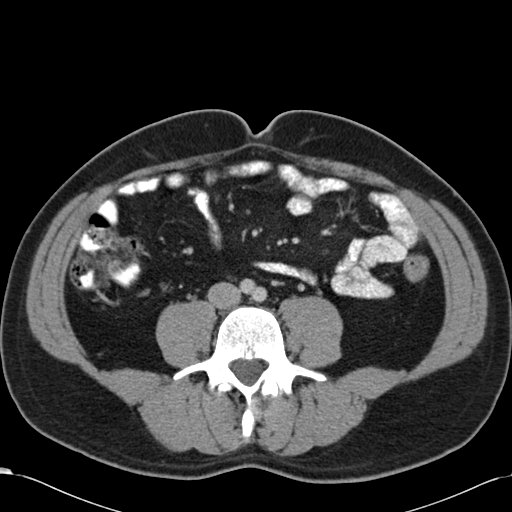
[im 12/60  soft-tissue]
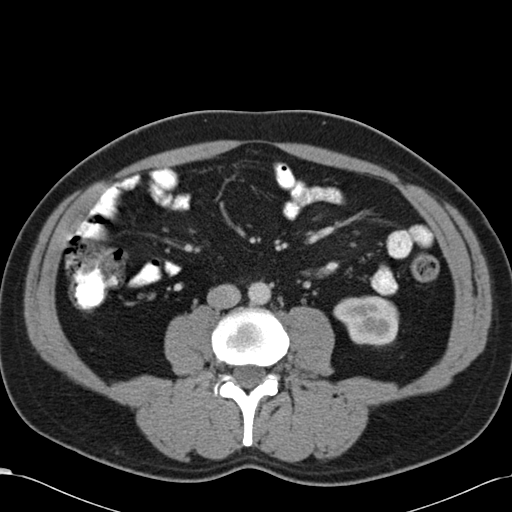
[im 15/60  soft-tissue]
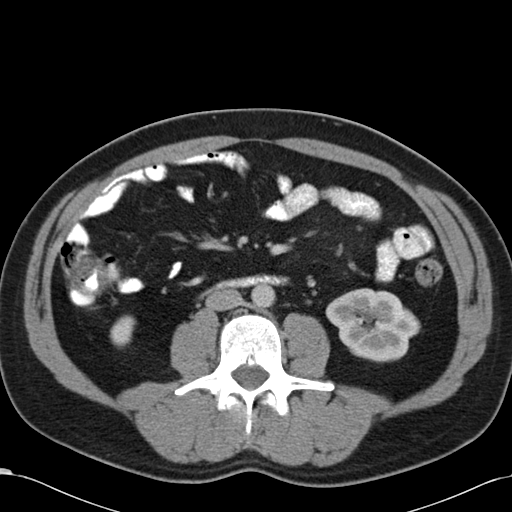
[im 19/60  soft-tissue]
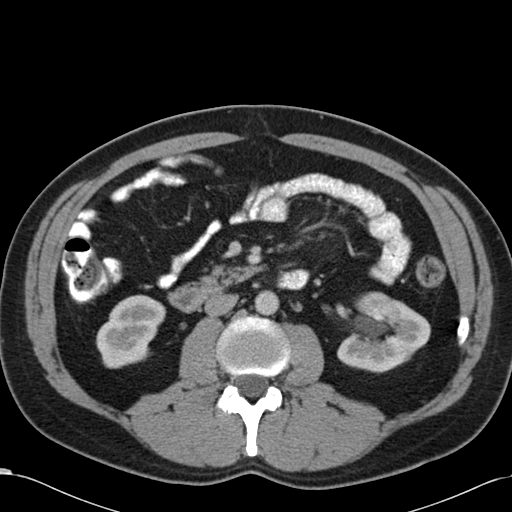
[im 23/60  soft-tissue]
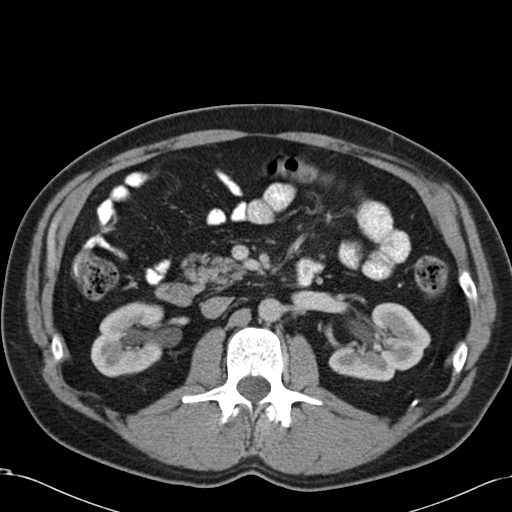
[im 26/60  soft-tissue]
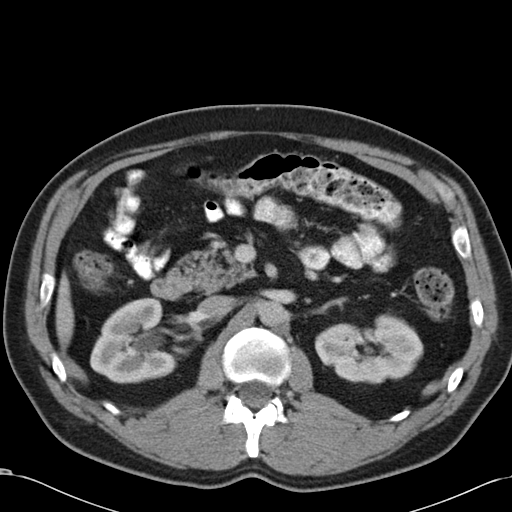
[im 34/60  soft-tissue]
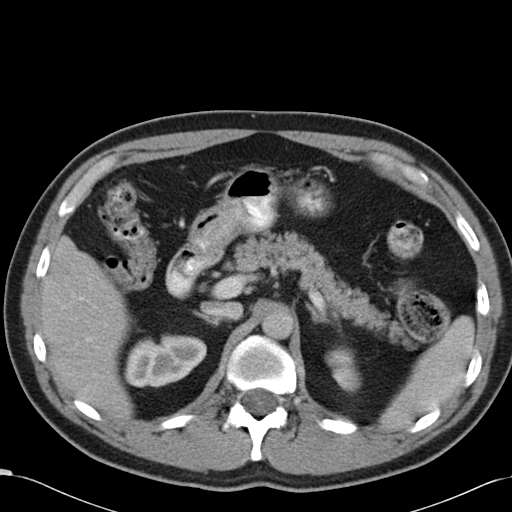
[im 37/60  soft-tissue]
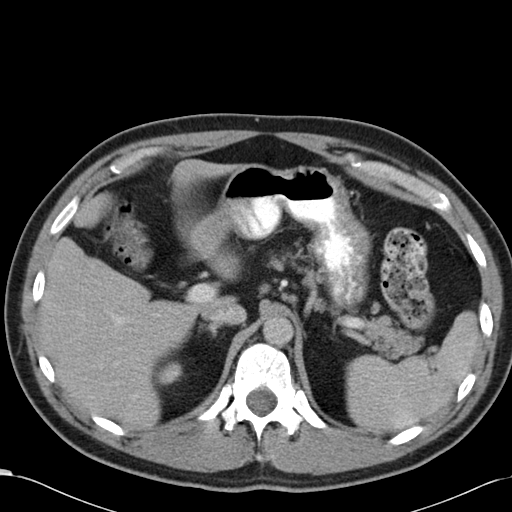
[im 37/60  bone]
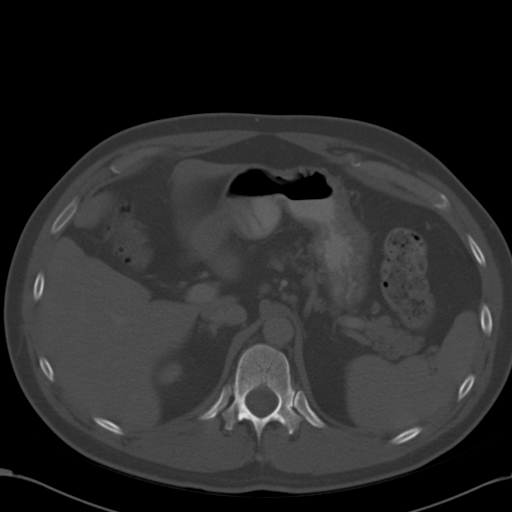
[im 41/60  soft-tissue]
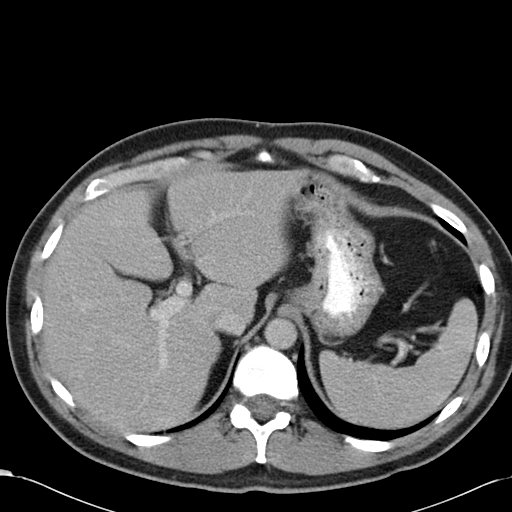
[im 45/60  soft-tissue]
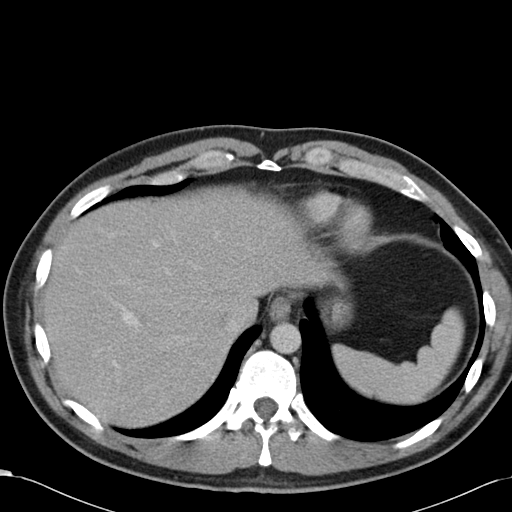
[im 48/60  soft-tissue]
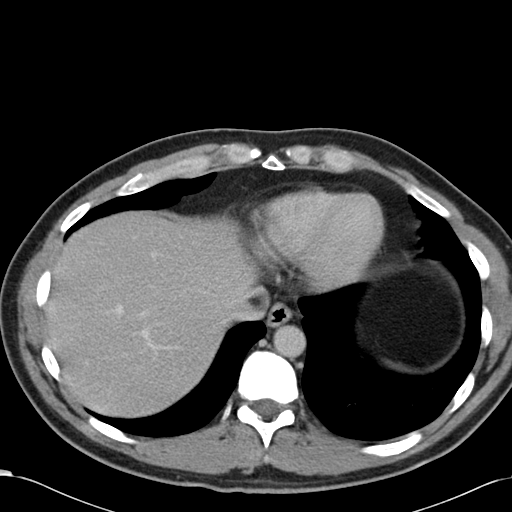
[im 52/60  soft-tissue]
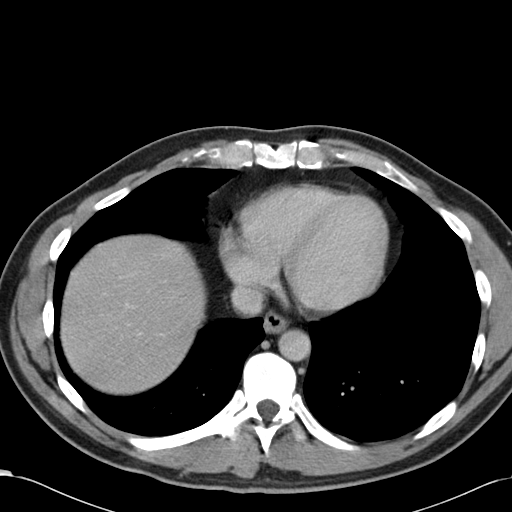
[im 56/60  soft-tissue]
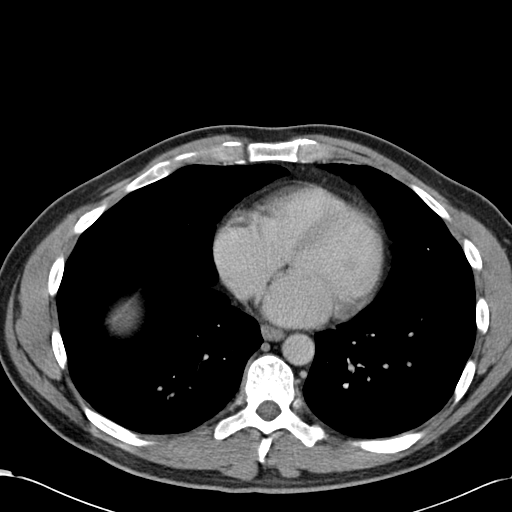

[Series 602: cor · coronal · 0.70mm/px · 3 of 113 slices shown]
[im 38/113  soft-tissue]
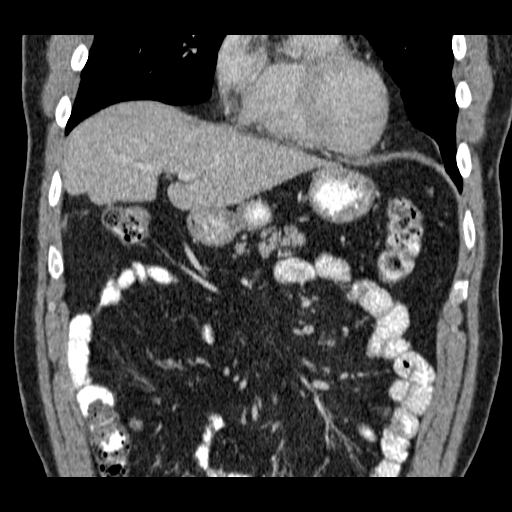
[im 50/113  soft-tissue]
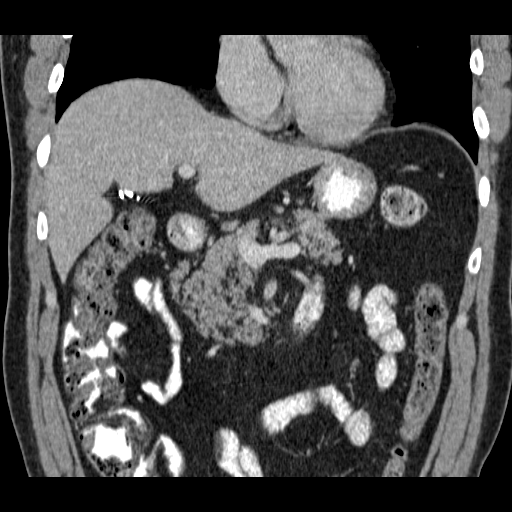
[im 63/113  soft-tissue]
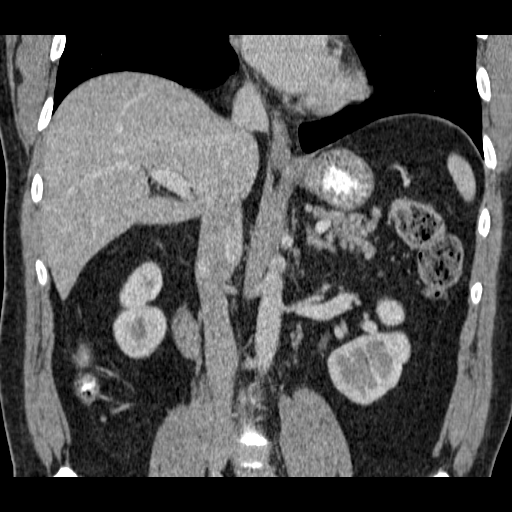

[17 of 46 positions shown; findings below may reference images not displayed]

FINDINGS: Lung bases show no acute findings. Heart size normal. No pericardial
or pleural effusion.

Liver is unremarkable. Cholecystectomy. Adrenal glands, kidneys,
spleen, pancreas, stomach and visualized small bowel are
unremarkable. A fair amount of stool is seen in the visualized
portion of the colon. Small midline ventral hernias contain fat. A
low gastrohepatic ligament lymph node measures 10 mm (previously 13
mm). Mild haziness and nodularity in the small bowel mesentery is
nonspecific. No worrisome lytic or sclerotic lesions.
IMPRESSION: 1. No acute findings to explain the patient's given symptoms.
2. Fair amount of stool in the visualized colon is indicative of
constipation.
3. Small midline ventral hernias contain fat.

## 2014-09-21 ENCOUNTER — Ambulatory Visit (INDEPENDENT_AMBULATORY_CARE_PROVIDER_SITE_OTHER): Payer: Commercial Managed Care - PPO | Admitting: Family

## 2014-09-21 ENCOUNTER — Encounter: Payer: Self-pay | Admitting: Family

## 2014-09-21 ENCOUNTER — Telehealth: Payer: Self-pay | Admitting: Family

## 2014-09-21 ENCOUNTER — Other Ambulatory Visit (INDEPENDENT_AMBULATORY_CARE_PROVIDER_SITE_OTHER): Payer: Commercial Managed Care - PPO

## 2014-09-21 VITALS — BP 124/80 | HR 66 | Temp 98.0°F | Resp 18 | Ht 75.0 in | Wt 216.2 lb

## 2014-09-21 DIAGNOSIS — Z0000 Encounter for general adult medical examination without abnormal findings: Secondary | ICD-10-CM

## 2014-09-21 DIAGNOSIS — M25541 Pain in joints of right hand: Secondary | ICD-10-CM | POA: Insufficient documentation

## 2014-09-21 DIAGNOSIS — Z808 Family history of malignant neoplasm of other organs or systems: Secondary | ICD-10-CM

## 2014-09-21 DIAGNOSIS — M79641 Pain in right hand: Secondary | ICD-10-CM

## 2014-09-21 DIAGNOSIS — Z23 Encounter for immunization: Secondary | ICD-10-CM

## 2014-09-21 LAB — LIPID PANEL
CHOL/HDL RATIO: 6
Cholesterol: 232 mg/dL — ABNORMAL HIGH (ref 0–200)
HDL: 40.8 mg/dL (ref >39.00)
LDL CALC: 168 mg/dL — AB (ref 0–99)
NonHDL: 191.2
Triglycerides: 118 mg/dL (ref 0.0–149.0)
VLDL: 23.6 mg/dL (ref 0.0–40.0)

## 2014-09-21 LAB — CBC
HCT: 45.7 % (ref 39.0–52.0)
Hemoglobin: 15.6 g/dL (ref 13.0–17.0)
MCHC: 34.2 g/dL (ref 30.0–36.0)
MCV: 88.1 fl (ref 78.0–100.0)
Platelets: 233 10*3/uL (ref 150.0–400.0)
RBC: 5.19 Mil/uL (ref 4.22–5.81)
RDW: 13.4 % (ref 11.5–15.5)
WBC: 7.8 10*3/uL (ref 4.0–10.5)

## 2014-09-21 LAB — BASIC METABOLIC PANEL
BUN: 13 mg/dL (ref 6–23)
CALCIUM: 9.3 mg/dL (ref 8.4–10.5)
CO2: 30 mEq/L (ref 19–32)
Chloride: 103 mEq/L (ref 96–112)
Creatinine, Ser: 1.1 mg/dL (ref 0.4–1.5)
GFR: 82.32 mL/min (ref >60.00)
Glucose, Bld: 82 mg/dL (ref 70–99)
POTASSIUM: 3.9 meq/L (ref 3.5–5.1)
SODIUM: 137 meq/L (ref 135–145)

## 2014-09-21 NOTE — Progress Notes (Signed)
Pre visit review using our clinic review tool, if applicable. No additional management support is needed unless otherwise documented below in the visit note. 

## 2014-09-21 NOTE — Assessment & Plan Note (Signed)
No inflammation or tenderness noted. Most likely start of osteoarthritis. Advised to trial ibuprofen as needed. Denies desire to take pills at this time.

## 2014-09-21 NOTE — Assessment & Plan Note (Signed)
Routine physical exam is normal. Obtain labs CBC, BMET, and lipid panel.   Lipid results indicate hyperlipidemia. Will start with 6 months of diet/change and exercise. If no improvement will consider starting a statin.  Lab Results  Component Value Date   CHOL 232* 09/21/2014   HDL 40.80 09/21/2014   LDLCALC 168* 09/21/2014   LDLDIRECT 156.1 05/28/2011   TRIG 118.0 09/21/2014   CHOLHDL 6 09/21/2014

## 2014-09-21 NOTE — Progress Notes (Signed)
   Subjective:    Patient ID: Samuel Bridges, male    DOB: 05/09/1971, 43 y.o.   MRN: [id number]  HPI:  Samuel Bridges is a 43 y.o. male who presents today for annual wellness exam.   1) Health Maintenance:  Health Maintenance: Dental -- Due for exam; Scheduled by patient Vision -- 3 years ago; Scheduled by patient Immunizations -- Flu shot given  2) Pain in fingers - comes and goes. Notices it when the weather changes. Just right hand. DIP joint and PIP. Denies any treatments for it.   No Known Allergies  No current outpatient prescriptions on file prior to visit.   No current facility-administered medications on file prior to visit.   Past Medical History  Diagnosis Date  . Chicken pox   . History of shingles   . Allergy   . Vaso vagal episode     had full cardiology evaluation for exertional symptoms: negative Echo, stress-test  . Skin lesions, generalized     close surveillance with family history of melanoma   Family History  Problem Relation Age of Onset  . Hypertension Mother   . Cancer Father     lung  . Basal cell carcinoma Father     aggressive - required radical neck dissection  . Melanoma Sister   . Basal cell carcinoma Sister   . Cancer Paternal Grandfather     lung cancer  . Diabetes Neg Hx   . Early death Neg Hx   . COPD Neg Hx   . Cancer Other     lung cancer    Review of Systems  See HPI    Objective:    BP 124/80  Pulse 66  Temp(Src) 98 F (36.7 C) (Oral)  Resp 18  Ht 6\' 3"  (1.905 m)  Wt 216 lb 3.2 oz (98.068 kg)  BMI 27.02 kg/m2  SpO2 98% Nursing note and vital signs reviewed.  Physical Exam  Constitutional: He is oriented to person, place, and time. He appears well-developed and well-nourished. No distress.  HENT:  Head: Normocephalic.  Right Ear: Hearing, tympanic membrane, external ear and ear canal normal.  Left Ear: Hearing, tympanic membrane, external ear and ear canal normal.  Nose: Nose normal.  Mouth/Throat: Uvula is  midline, oropharynx is clear and moist and mucous membranes are normal.  Eyes: Conjunctivae are normal. Pupils are equal, round, and reactive to light.  Neck: Normal range of motion. Neck supple. No JVD present. No tracheal deviation present. No thyromegaly present.  Cardiovascular: Normal rate, normal heart sounds and intact distal pulses.   Pulmonary/Chest: Effort normal and breath sounds normal.  Abdominal: Soft. Bowel sounds are normal. He exhibits no distension and no mass. There is no tenderness. There is no rebound and no guarding.  Musculoskeletal: Normal range of motion.  Lymphadenopathy:    He has no cervical adenopathy.  Neurological: He is alert and oriented to person, place, and time. He has normal reflexes. No cranial nerve deficit.  Skin: Skin is warm and dry.  Psychiatric: He has a normal mood and affect. His behavior is normal. Judgment and thought content normal.        Assessment & Plan:

## 2014-09-21 NOTE — Telephone Encounter (Signed)
Please call patient to inform him that his lab work was normal with the exception of his LDL cholesterol which was [id number]. The goal for this number is <100. Recommend modifying diet and exercise as a first attempt to remedy this. Increase fruits, vegetables and fiber and decrease the saturated fat intake. I'd like him to schedule an appointment in about 6 months to recheck his lipids.

## 2014-09-21 NOTE — Patient Instructions (Addendum)
Thank you for choosing [facility].  Summary/Instructions:   Please stop by the lab to have your blood work done.  Your referral to dermatology has been made. You should hear back in less than one week for your referral.  If you have not please call us.  We will have the results for your lab work shortly and be in contact.   Follow up for a physical in about a year.

## 2014-09-22 NOTE — Telephone Encounter (Signed)
Called pt to let him know that his labs were normal other than the cholesterol being a little high. I encouraged him to eat more fruits, veggies, and fibers. Also to try to exercise regularly. He was told to follow back up in 6 months for a lipid recheck.

## 2015-11-06 ENCOUNTER — Ambulatory Visit (INDEPENDENT_AMBULATORY_CARE_PROVIDER_SITE_OTHER): Payer: Commercial Managed Care - PPO | Admitting: Family

## 2015-11-06 ENCOUNTER — Encounter: Payer: Self-pay | Admitting: Family

## 2015-11-06 VITALS — BP 110/76 | HR 63 | Temp 98.0°F | Resp 18 | Ht 75.0 in | Wt 220.8 lb

## 2015-11-06 DIAGNOSIS — Z0000 Encounter for general adult medical examination without abnormal findings: Secondary | ICD-10-CM | POA: Diagnosis not present

## 2015-11-06 DIAGNOSIS — Z23 Encounter for immunization: Secondary | ICD-10-CM | POA: Diagnosis not present

## 2015-11-06 NOTE — Patient Instructions (Addendum)
Thank you for choosing [facility].  Summary/Instructions:  Health Maintenance, Male A healthy lifestyle and preventative care can promote health and wellness.  Maintain regular health, dental, and eye exams.  Eat a healthy diet. Foods like vegetables, fruits, whole grains, low-fat dairy products, and lean protein foods contain the nutrients you need and are low in calories. Decrease your intake of foods high in solid fats, added sugars, and salt. Get information about a proper diet from your health care provider, if necessary.  Regular physical exercise is one of the most important things you can do for your health. Most adults should get at least 150 minutes of moderate-intensity exercise (any activity that increases your heart rate and causes you to sweat) each week. In addition, most adults need muscle-strengthening exercises on 2 or more days a week.   Maintain a healthy weight. The body mass index (BMI) is a screening tool to identify possible weight problems. It provides an estimate of body fat based on height and weight. Your health care provider can find your BMI and can help you achieve or maintain a healthy weight. For males 20 years and older:  A BMI below 18.5 is considered underweight.  A BMI of 18.5 to 24.9 is normal.  A BMI of 25 to 29.9 is considered overweight.  A BMI of 30 and above is considered obese.  Maintain normal blood lipids and cholesterol by exercising and minimizing your intake of saturated fat. Eat a balanced diet with plenty of fruits and vegetables. Blood tests for lipids and cholesterol should begin at age 20 and be repeated every 5 years. If your lipid or cholesterol levels are high, you are over age 50, or you are at high risk for heart disease, you may need your cholesterol levels checked more frequently.Ongoing high lipid and cholesterol levels should be treated with medicines if diet and exercise are not working.  If you smoke, find out from  your health care provider how to quit. If you do not use tobacco, do not start.  Lung cancer screening is recommended for adults aged 55-80 years who are at high risk for developing lung cancer because of a history of smoking. A yearly low-dose CT scan of the lungs is recommended for people who have at least a 30-pack-year history of smoking and are current smokers or have quit within the past 15 years. A pack year of smoking is smoking an average of 1 pack of cigarettes a day for 1 year (for example, a 30-pack-year history of smoking could mean smoking 1 pack a day for 30 years or 2 packs a day for 15 years). Yearly screening should continue until the smoker has stopped smoking for at least 15 years. Yearly screening should be stopped for people who develop a health problem that would prevent them from having lung cancer treatment.  If you choose to drink alcohol, do not have more than 2 drinks per day. One drink is considered to be 12 oz (360 mL) of beer, 5 oz (150 mL) of wine, or 1.5 oz (45 mL) of liquor.  Avoid the use of street drugs. Do not share needles with anyone. Ask for help if you need support or instructions about stopping the use of drugs.  High blood pressure causes heart disease and increases the risk of stroke. High blood pressure is more likely to develop in:  People who have blood pressure in the end of the normal range (100-139/85-89 mm Hg).  People who are overweight   or obese.  People who are African American.  If you are 18-39 years of age, have your blood pressure checked every 3-5 years. If you are 40 years of age or older, have your blood pressure checked every year. You should have your blood pressure measured twice--once when you are at a hospital or clinic, and once when you are not at a hospital or clinic. Record the average of the two measurements. To check your blood pressure when you are not at a hospital or clinic, you can use:  An automated blood pressure machine  at a pharmacy.  A home blood pressure monitor.  If you are 45-79 years old, ask your health care provider if you should take aspirin to prevent heart disease.  Diabetes screening involves taking a blood sample to check your fasting blood sugar level. This should be done once every 3 years after age 45 if you are at a normal weight and without risk factors for diabetes. Testing should be considered at a younger age or be carried out more frequently if you are overweight and have at least 1 risk factor for diabetes.  Colorectal cancer can be detected and often prevented. Most routine colorectal cancer screening begins at the age of 50 and continues through age 75. However, your health care provider may recommend screening at an earlier age if you have risk factors for colon cancer. On a yearly basis, your health care provider may provide home test kits to check for hidden blood in the stool. A small camera at the end of a tube may be used to directly examine the colon (sigmoidoscopy or colonoscopy) to detect the earliest forms of colorectal cancer. Talk to your health care provider about this at age 50 when routine screening begins. A direct exam of the colon should be repeated every 5-10 years through age 75, unless early forms of precancerous polyps or small growths are found.  People who are at an increased risk for hepatitis B should be screened for this virus. You are considered at high risk for hepatitis B if:  You were born in a country where hepatitis B occurs often. Talk with your health care provider about which countries are considered high risk.  Your parents were born in a high-risk country and you have not received a shot to protect against hepatitis B (hepatitis B vaccine).  You have HIV or AIDS.  You use needles to inject street drugs.  You live with, or have sex with, someone who has hepatitis B.  You are a man who has sex with other men (MSM).  You get hemodialysis  treatment.  You take certain medicines for conditions like cancer, organ transplantation, and autoimmune conditions.  Hepatitis C blood testing is recommended for all people born from 1945 through 1965 and any individual with known risk factors for hepatitis C.  Healthy men should no longer receive prostate-specific antigen (PSA) blood tests as part of routine cancer screening. Talk to your health care provider about prostate cancer screening.  Testicular cancer screening is not recommended for adolescents or adult males who have no symptoms. Screening includes self-exam, a health care provider exam, and other screening tests. Consult with your health care provider about any symptoms you have or any concerns you have about testicular cancer.  Practice safe sex. Use condoms and avoid high-risk sexual practices to reduce the spread of sexually transmitted infections (STIs).  You should be screened for STIs, including gonorrhea and chlamydia if:  You are sexually   active and are younger than 24 years.  You are older than 24 years, and your health care provider tells you that you are at risk for this type of infection.  Your sexual activity has changed since you were last screened, and you are at an increased risk for chlamydia or gonorrhea. Ask your health care provider if you are at risk.  If you are at risk of being infected with HIV, it is recommended that you take a prescription medicine daily to prevent HIV infection. This is called pre-exposure prophylaxis (PrEP). You are considered at risk if:  You are a man who has sex with other men (MSM).  You are a heterosexual man who is sexually active with multiple partners.  You take drugs by injection.  You are sexually active with a partner who has HIV.  Talk with your health care provider about whether you are at high risk of being infected with HIV. If you choose to begin PrEP, you should first be tested for HIV. You should then be tested  every 3 months for as long as you are taking PrEP.  Use sunscreen. Apply sunscreen liberally and repeatedly throughout the day. You should seek shade when your shadow is shorter than you. Protect yourself by wearing long sleeves, pants, a wide-brimmed hat, and sunglasses year round whenever you are outdoors.  Tell your health care provider of new moles or changes in moles, especially if there is a change in shape or color. Also, tell your health care provider if a mole is larger than the size of a pencil eraser.  A one-time screening for abdominal aortic aneurysm (AAA) and surgical repair of large AAAs by ultrasound is recommended for men aged 65-75 years who are current or former smokers.  Stay current with your vaccines (immunizations).   This information is not intended to replace advice given to you by your health care provider. Make sure you discuss any questions you have with your health care provider.   Document Released: 05/23/2008 Document Revised: 12/16/2014 Document Reviewed: 04/22/2011 Elsevier Interactive Patient Education 2016 Elsevier Inc.  

## 2015-11-06 NOTE — Assessment & Plan Note (Signed)
1) Anticipatory Guidance: Discussed importance of wearing a seatbelt while driving and not texting while driving; changing batteries in smoke detector at least once annually; wearing suntan lotion when outside; eating a balanced and moderate diet; getting physical activity at least 30 minutes per day.  2) Immunizations / Screenings / Labs:  Flu shot updated today. All other immunizations are up-to-date per recommendations. Due for a vision screen which will be scheduled independently. All other screenings are up-to-date per recommendations. Obtain CBC, CMET, Lipid profile and TSH.   Overall well exam. Risk factors for cardiovascular disease include nutrition and overweight. Discussed importance of a moderate and varied nutritional intake that emphasizes nutrient dense foods and is low in saturated/transfer fats. Discussed avoiding processed foods and minimizing intake. Increase physical activity to 30 minutes daily. Continue other healthy lifestyle choices and behaviors. Follow-up prevention exam in 1 year. Follow-up office visit pending blood work as needed.

## 2015-11-06 NOTE — Progress Notes (Signed)
Pre visit review using our clinic review tool, if applicable. No additional management support is needed unless otherwise documented below in the visit note. 

## 2015-11-06 NOTE — Progress Notes (Signed)
Subjective:    Patient ID: Samuel Bridges, male    DOB: [birth year], 44 y.o.   MRN: [id number]  Chief Complaint  Patient presents with  . CPE    Not fasting    HPI:  Samuel Bridges is a 44 y.o. male who presents today for an annual wellness visit.   1) Health Maintenance -   Diet - Averages about 2-3 meals per day consisting of fruits, occasional processed food, sandwiches, meats, vegetables. Caffeine intake of about 2-3 cups of caffeine daily.   Exercise - Generally active but no structured physical activity.    2) Preventative Exams / Immunizations:  Dental -- Due for exam  Vision -- Due for exam   Health Maintenance  Topic Date Due  . HIV Screening  09/17/1987  . INFLUENZA VACCINE  07/10/2015  . TETANUS/TDAP  10/28/2023    Immunization History  Administered Date(s) Administered  . Influenza,inj,Quad PF,36+ Mos 09/21/2014, 11/06/2015  . Tdap 10/27/2013    No Known Allergies   No outpatient prescriptions prior to visit.   No facility-administered medications prior to visit.     Past Medical History  Diagnosis Date  . Chicken pox   . History of shingles   . Allergy   . Vaso vagal episode     had full cardiology evaluation for exertional symptoms: negative Echo, stress-test  . Skin lesions, generalized     close surveillance with family history of melanoma     Past Surgical History  Procedure Laterality Date  . Appendectomy      '97  . Cholecystectomy      '10 acute; laproscopic  . Shoulder arthroscopy w/ rotator cuff repair      right shoulde  2010  . Eye surgery      correction of stabismus - adjustable suture technique     Family History  Problem Relation Age of Onset  . Hypertension Mother   . Cancer Father     lung  . Basal cell carcinoma Father     aggressive - required radical neck dissection  . Melanoma Sister   . Basal cell carcinoma Sister   . Cancer Paternal Grandfather     lung cancer  . Diabetes Neg Hx   . Early death Neg  Hx   . COPD Neg Hx   . Cancer Other     lung cancer     Social History   Social History  . Marital Status: Married    Spouse Name: N/A  . Number of Children: 3  . Years of Education: 16   Occupational History  . businessman     Gen' mgr for [employer]   Social History Main Topics  . Smoking status: Never Smoker   . Smokeless tobacco: Never Used  . Alcohol Use: Yes  . Drug Use: No  . Sexual Activity:    Partners: Female   Other Topics Concern  . Not on file   Social History Narrative   [employer] BS acctg; Married '02.  2 sons - '03, '06; 1 dtr - '09. 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 Marriage is in excellent health. 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 HENT: Head: Denies headache or neck pain Ears: Denies changes in hearing, ringing in ears, earache, drainage Nose: Denies discharge, stuffiness, itching, nosebleed, sinus pain Throat: Denies sore throat,  hoarseness, dry mouth, sores, thrush Eyes: Denies loss/changes in vision, pain, redness, blurry/double vision, flashing lights Cardiovascular: Denies chest pain/discomfort, tightness, palpitations, shortness of breath with activity, difficulty lying down, swelling, sudden awakening with shortness of breath Respiratory: Denies shortness of breath, cough, sputum production, wheezing Gastrointestinal: Denies dysphasia, heartburn, change in appetite, nausea, change in bowel habits, rectal bleeding, constipation, diarrhea, yellow skin or eyes Genitourinary: Denies frequency, urgency, burning/pain, blood in urine, incontinence, change in urinary strength. Musculoskeletal: Denies muscle/joint pain, stiffness, back pain, redness or swelling of joints, trauma Skin: Denies rashes, lumps, itching, dryness, color changes, or hair/nail changes Neurological: Denies  dizziness, fainting, seizures, weakness, numbness, tingling, tremor Psychiatric - Denies nervousness, stress, depression or memory loss Endocrine: Denies heat or cold intolerance, sweating, frequent urination, excessive thirst, changes in appetite Hematologic: Denies ease of bruising or bleeding     Objective:    BP 110/76 mmHg  Pulse 63  Temp(Src) 98 F (36.7 C) (Oral)  Resp 18  Ht  (1.905 m)  Wt 220 lb 12.8 oz (100.154 kg)  BMI 27.60 kg/m2  SpO2 97% Nursing note and vital signs reviewed.  Physical Exam  Constitutional: He is oriented to person, place, and time. He appears well-developed and well-nourished.  HENT:  Head: Normocephalic.  Right Ear: Hearing, tympanic membrane, external ear and ear canal normal.  Left Ear: Hearing, tympanic membrane, external ear and ear canal normal.  Nose: Nose normal.  Mouth/Throat: Uvula is midline, oropharynx is clear and moist and mucous membranes are normal.  Eyes: Conjunctivae and EOM are normal. Pupils are equal, round, and reactive to light.  Neck: Neck supple. No JVD present. No tracheal deviation present. No thyromegaly present.  Cardiovascular: Normal rate, regular rhythm, normal heart sounds and intact distal pulses.   Pulmonary/Chest: Effort normal and breath sounds normal.  Abdominal: Soft. Bowel sounds are normal. He exhibits no distension and no mass. There is no tenderness. There is no rebound and no guarding.  Musculoskeletal: Normal range of motion. He exhibits no edema or tenderness.  Lymphadenopathy:    He has no cervical adenopathy.  Neurological: He is alert and oriented to person, place, and time. He has normal reflexes. No cranial nerve deficit. He exhibits normal muscle tone. Coordination normal.  Skin: Skin is warm and dry.  Psychiatric: He has a normal mood and affect. His behavior is normal. Judgment and thought content normal.       Assessment & Plan:   Problem List Items Addressed This Visit      Other     Routine health maintenance - Primary    1) Anticipatory Guidance: Discussed importance of wearing a seatbelt while driving and not texting while driving; changing batteries in smoke detector at least once annually; wearing suntan lotion when outside; eating a balanced and moderate diet; getting physical activity at least 30 minutes per day.  2) Immunizations / Screenings / Labs:  Flu shot updated today. All other immunizations are up-to-date per recommendations. Due for a vision screen which will be scheduled independently. All other screenings are up-to-date per recommendations. Obtain CBC, CMET, Lipid profile and TSH.   Overall well exam. Risk factors for cardiovascular disease include nutrition and overweight. Discussed importance of a moderate and varied nutritional intake that emphasizes nutrient dense foods and is low in saturated/transfer fats. Discussed avoiding processed foods and minimizing intake. Increase physical activity to 30 minutes daily. Continue other healthy lifestyle choices and behaviors. Follow-up prevention exam in 1 year. Follow-up office visit pending blood work  as needed.       Relevant Orders   CBC   Comprehensive metabolic panel   Lipid panel   TSH

## 2015-11-29 ENCOUNTER — Other Ambulatory Visit (INDEPENDENT_AMBULATORY_CARE_PROVIDER_SITE_OTHER): Payer: Commercial Managed Care - PPO

## 2015-11-29 ENCOUNTER — Encounter: Payer: Self-pay | Admitting: Family

## 2015-11-29 ENCOUNTER — Other Ambulatory Visit: Payer: Self-pay | Admitting: Family

## 2015-11-29 DIAGNOSIS — R7989 Other specified abnormal findings of blood chemistry: Secondary | ICD-10-CM

## 2015-11-29 DIAGNOSIS — R945 Abnormal results of liver function studies: Principal | ICD-10-CM

## 2015-11-29 DIAGNOSIS — Z0000 Encounter for general adult medical examination without abnormal findings: Secondary | ICD-10-CM

## 2015-11-29 LAB — CBC
HCT: 48 % (ref 39.0–52.0)
HEMOGLOBIN: 16 g/dL (ref 13.0–17.0)
MCHC: 33.4 g/dL (ref 30.0–36.0)
MCV: 87.9 fl (ref 78.0–100.0)
PLATELETS: 249 10*3/uL (ref 150.0–400.0)
RBC: 5.46 Mil/uL (ref 4.22–5.81)
RDW: 12.9 % (ref 11.5–15.5)
WBC: 7.4 10*3/uL (ref 4.0–10.5)

## 2015-11-29 LAB — LIPID PANEL
Cholesterol: 215 mg/dL — ABNORMAL HIGH (ref 0–200)
HDL: 46.7 mg/dL (ref >39.00)
LDL CALC: 141 mg/dL — AB (ref 0–99)
NONHDL: 168.44
TRIGLYCERIDES: 138 mg/dL (ref 0.0–149.0)
Total CHOL/HDL Ratio: 5
VLDL: 27.6 mg/dL (ref 0.0–40.0)

## 2015-11-29 LAB — COMPREHENSIVE METABOLIC PANEL
ALT: 75 U/L — AB (ref 0–53)
AST: 40 U/L — ABNORMAL HIGH (ref 0–37)
Albumin: 4.4 g/dL (ref 3.5–5.2)
Alkaline Phosphatase: 112 U/L (ref 39–117)
BUN: 16 mg/dL (ref 6–23)
CALCIUM: 9.8 mg/dL (ref 8.4–10.5)
CHLORIDE: 103 meq/L (ref 96–112)
CO2: 31 mEq/L (ref 19–32)
Creatinine, Ser: 1.08 mg/dL (ref 0.40–1.50)
GFR: 79.24 mL/min (ref >60.00)
GLUCOSE: 88 mg/dL (ref 70–99)
POTASSIUM: 4.3 meq/L (ref 3.5–5.1)
Sodium: 140 mEq/L (ref 135–145)
Total Bilirubin: 0.6 mg/dL (ref 0.2–1.2)
Total Protein: 7.6 g/dL (ref 6.0–8.3)

## 2015-11-29 LAB — TSH: TSH: 2.57 u[IU]/mL (ref 0.35–4.50)

## 2015-12-22 ENCOUNTER — Encounter: Payer: Self-pay | Admitting: Family

## 2015-12-22 ENCOUNTER — Other Ambulatory Visit (INDEPENDENT_AMBULATORY_CARE_PROVIDER_SITE_OTHER): Payer: Commercial Managed Care - PPO

## 2015-12-22 DIAGNOSIS — R7989 Other specified abnormal findings of blood chemistry: Secondary | ICD-10-CM | POA: Diagnosis not present

## 2015-12-22 DIAGNOSIS — R945 Abnormal results of liver function studies: Principal | ICD-10-CM

## 2015-12-22 LAB — HEPATIC FUNCTION PANEL
ALT: 74 U/L — ABNORMAL HIGH (ref 0–53)
AST: 42 U/L — AB (ref 0–37)
Albumin: 4.4 g/dL (ref 3.5–5.2)
Alkaline Phosphatase: 108 U/L (ref 39–117)
BILIRUBIN DIRECT: 0.2 mg/dL (ref 0.0–0.3)
BILIRUBIN TOTAL: 0.7 mg/dL (ref 0.2–1.2)
Total Protein: 7.7 g/dL (ref 6.0–8.3)

## 2016-06-25 ENCOUNTER — Ambulatory Visit (INDEPENDENT_AMBULATORY_CARE_PROVIDER_SITE_OTHER): Payer: Commercial Managed Care - PPO | Admitting: Family

## 2016-06-25 ENCOUNTER — Encounter: Payer: Self-pay | Admitting: Family

## 2016-06-25 ENCOUNTER — Ambulatory Visit (INDEPENDENT_AMBULATORY_CARE_PROVIDER_SITE_OTHER)
Admission: RE | Admit: 2016-06-25 | Discharge: 2016-06-25 | Disposition: A | Discharge status: Home / Self Care | Payer: Commercial Managed Care - PPO | Source: Ambulatory Visit | Attending: Family | Admitting: Family

## 2016-06-25 VITALS — BP 118/84 | HR 67 | Temp 97.8°F | Ht 74.5 in | Wt 210.2 lb

## 2016-06-25 DIAGNOSIS — R05 Cough: Secondary | ICD-10-CM

## 2016-06-25 DIAGNOSIS — Z0001 Encounter for general adult medical examination with abnormal findings: Secondary | ICD-10-CM

## 2016-06-25 DIAGNOSIS — R059 Cough, unspecified: Secondary | ICD-10-CM

## 2016-06-25 DIAGNOSIS — Z0000 Encounter for general adult medical examination without abnormal findings: Secondary | ICD-10-CM

## 2016-06-25 MED ORDER — FLUTICASONE FUROATE-VILANTEROL 100-25 MCG/INH IN AEPB: 1.0000 | INHALATION_SPRAY | Freq: Every day | RESPIRATORY_TRACT | Status: DC

## 2016-06-25 NOTE — Assessment & Plan Note (Signed)
1) Anticipatory Guidance: Discussed importance of wearing a seatbelt while driving and not texting while driving; changing batteries in smoke detector at least once annually; wearing suntan lotion when outside; eating a balanced and moderate diet; getting physical activity at least 30 minutes per day.  2) Immunizations / Screenings / Labs:  All immunizations are up-to-date per recommendations. Due for a dental screen encouraged to be completed independently. All other screenings are up-to-date per recommendations.Obtain CBC, CMET, and Lipid profile.  Overall well exam with minimal risk factors for cardiovascular disease noted. Encouraged resuming exercise upon feeling better. Continue with nutritional intake that is moderate, balance, and varied. Continue other healthy lifestyle behaviors and choices. Follow-up prevention exam in 1 year. Follow-up office visit pending blood work as necessary.

## 2016-06-25 NOTE — Progress Notes (Signed)
Subjective:    Patient ID: Samuel Bridges, male    DOB: [birth year], 45 y.o.   MRN: [id number]  Chief Complaint  Patient presents with  . Annual Exam  . Weight Loss    Pt states he had this cold x's 2 months was on 2 round of antibiotics still don't feel 100% better, but within those months he has loss abt 15lbs    HPI:  Samuel Bridges is a 45 y.o. male who presents today for an annual wellness visit.   1) Health Maintenance -   Diet - Averages about 2-3 meals per day consisting of fruits, vegetables, chicken, beef and pork; Caffeine intake of about 1-2 cups per day  Exercise - Couple of times per week generally   2) Preventative Exams / Immunizations:  Dental -- Due for exam  Vision --  Up to date   Health Maintenance  Topic Date Due  . HIV Screening  09/17/1987  . INFLUENZA VACCINE  07/09/2016  . TETANUS/TDAP  10/28/2023    Immunization History  Administered Date(s) Administered  . Influenza,inj,Quad PF,36+ Mos 09/21/2014, 11/06/2015  . Tdap 10/27/2013    3.) Cough - This is a new problem. Associated symptom of a cough has been going on for about 2 months. Originally started with sore throat and additional symptoms. No fevers. Timing of symptoms is worse during the day and evening. Modifying factors include 2 different courses of antibiotics which generally improved majority of the symptoms but continues to cough. Described as non-productive and continues to experience some drainage. Course of the symptoms have generally stayed the same. Denies symptoms of heartburn.    No Known Allergies   No outpatient prescriptions prior to visit.   No facility-administered medications prior to visit.     Past Medical History  Diagnosis Date  . Chicken pox   . History of shingles   . Allergy   . Vaso vagal episode     had full cardiology evaluation for exertional symptoms: negative Echo, stress-test  . Skin lesions, generalized     close surveillance with family history  of melanoma     Past Surgical History  Procedure Laterality Date  . Appendectomy      '97  . Cholecystectomy      '10 acute; laproscopic  . Shoulder arthroscopy w/ rotator cuff repair      right shoulde  2010  . Eye surgery      correction of stabismus - adjustable suture technique     Family History  Problem Relation Age of Onset  . Hypertension Mother   . Cancer Father     lung  . Basal cell carcinoma Father     aggressive - required radical neck dissection  . Melanoma Sister   . Basal cell carcinoma Sister   . Cancer Paternal Grandfather     lung cancer  . Diabetes Neg Hx   . Early death Neg Hx   . COPD Neg Hx   . Cancer Other     lung cancer     Social History   Social History  . Marital Status: Married    Spouse Name: N/A  . Number of Children: 3  . Years of Education: 16   Occupational History  . businessman     Gen' mgr for [employer]   Social History Main Topics  . Smoking status: Never Smoker   . Smokeless tobacco: Never Used  . Alcohol Use: Yes  Comment: Occasionally   . Drug Use: No  . Sexual Activity:    Partners: Female   Other Topics Concern  . Not on file   Social History Narrative   [employer] BS acctg; Married '02.  2 sons - '03, '06; 1 dtr - '09. 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 No history of abuse. Marriage is in excellent health. 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 HENT: Head: Denies headache or neck pain Ears: Denies changes in hearing, ringing in ears, earache, drainage Nose: Denies discharge, stuffiness, itching, nosebleed, sinus pain Throat: Denies sore throat, hoarseness, dry mouth, sores, thrush Eyes: Denies loss/changes in vision, pain, redness, blurry/double vision, flashing lights Cardiovascular: Denies chest pain/discomfort, tightness, palpitations, shortness  of breath with activity, difficulty lying down, swelling, sudden awakening with shortness of breath Respiratory: Denies shortness of breath, sputum production, wheezing Positive for a cough. Gastrointestinal: Denies dysphasia, heartburn, change in appetite, nausea, change in bowel habits, rectal bleeding, constipation, diarrhea, yellow skin or eyes Genitourinary: Denies frequency, urgency, burning/pain, blood in urine, incontinence, change in urinary strength. Musculoskeletal: Denies muscle/joint pain, stiffness, back pain, redness or swelling of joints, trauma Skin: Denies rashes, lumps, itching, dryness, color changes, or hair/nail changes Neurological: Denies dizziness, fainting, seizures, weakness, numbness, tingling, tremor Psychiatric - Denies nervousness, stress, depression or memory loss Endocrine: Denies heat or cold intolerance, sweating, frequent urination, excessive thirst, changes in appetite Hematologic: Denies ease of bruising or bleeding     Objective:     BP 118/84 mmHg  Pulse 67  Temp(Src) 97.8 F (36.6 C) (Oral)  Ht 6' 2.5" (1.892 m)  Wt 210 lb 3.2 oz (95.346 kg)  BMI 26.64 kg/m2  SpO2 94% Nursing note and vital signs reviewed.    Physical Exam  Constitutional: He is oriented to person, place, and time. He appears well-developed and well-nourished.  HENT:  Head: Normocephalic.  Right Ear: Hearing, tympanic membrane, external ear and ear canal normal.  Left Ear: Hearing, tympanic membrane, external ear and ear canal normal.  Nose: Nose normal.  Mouth/Throat: Uvula is midline, oropharynx is clear and moist and mucous membranes are normal.  Eyes: Conjunctivae and EOM are normal. Pupils are equal, round, and reactive to light.  Neck: Neck supple. No JVD present. No tracheal deviation present. No thyromegaly present.  Cardiovascular: Normal rate, regular rhythm, normal heart sounds and intact distal pulses.   Pulmonary/Chest: Effort normal and breath sounds  normal.  Abdominal: Soft. Bowel sounds are normal. He exhibits no distension and no mass. There is no tenderness. There is no rebound and no guarding.  Musculoskeletal: Normal range of motion. He exhibits no edema or tenderness.  Lymphadenopathy:    He has no cervical adenopathy.  Neurological: He is alert and oriented to person, place, and time. He has normal reflexes. No cranial nerve deficit. He exhibits normal muscle tone. Coordination normal.  Skin: Skin is warm and dry.  Psychiatric: He has a normal mood and affect. His behavior is normal. Judgment and thought content normal.       Assessment & Plan:   Problem List Items Addressed This Visit      Other   Routine health maintenance - Primary    1) Anticipatory Guidance: Discussed importance of wearing a seatbelt while driving and not texting while driving; changing batteries in smoke detector at least once annually; wearing suntan lotion when outside; eating  a balanced and moderate diet; getting physical activity at least 30 minutes per day.  2) Immunizations / Screenings / Labs:  All immunizations are up-to-date per recommendations. Due for a dental screen encouraged to be completed independently. All other screenings are up-to-date per recommendations.Obtain CBC, CMET, and Lipid profile.  Overall well exam with minimal risk factors for cardiovascular disease noted. Encouraged resuming exercise upon feeling better. Continue with nutritional intake that is moderate, balance, and varied. Continue other healthy lifestyle behaviors and choices. Follow-up prevention exam in 1 year. Follow-up office visit pending blood work as necessary.      Relevant Orders   CBC   Comprehensive metabolic panel   Lipid panel   Cough    Cough that is refractory to multiple courses of antibiotics with concern for possible resolving bronchitis although given recent weight loss, obtain chest x-ray to rule out underlying pathology. Not likely bacterial or  infection process. Does not appear to be reflux related with no symptoms. Most likely postnasal drip. Start sample of Breo. Continue over-the-counter medication as needed for symptom relief pending x-ray results.      Relevant Orders   DG Chest 2 View       I am having Mr. Holberg start on fluticasone furoate-vilanterol.   Meds ordered this encounter  Medications  . fluticasone furoate-vilanterol (BREO ELLIPTA) 100-25 MCG/INH AEPB    Sig: Inhale 1 puff into the lungs daily.    Dispense:  28 each    Refill:  0    Order Specific Question:  Supervising Provider    Answer:  Hillard Danker A [[id number]]     Follow-up: Return if symptoms worsen or fail to improve.   Jeanine Luz, FNP

## 2016-06-25 NOTE — Assessment & Plan Note (Signed)
Cough that is refractory to multiple courses of antibiotics with concern for possible resolving bronchitis although given recent weight loss, obtain chest x-ray to rule out underlying pathology. Not likely bacterial or infection process. Does not appear to be reflux related with no symptoms. Most likely postnasal drip. Start sample of Breo. Continue over-the-counter medication as needed for symptom relief pending x-ray results.

## 2016-06-25 NOTE — Patient Instructions (Addendum)
Thank you for choosing [facility].  Summary/Instructions:  Your prescription(s) have been submitted to your pharmacy or been printed and provided for you. Please take as directed and contact our office if you believe you are having problem(s) with the medication(s) or have any questions.  Please stop by the lab on the lower level of the building for your blood work. Your results will be released to MyChart (or called to you) after review, usually within 72 hours after test completion. If any changes need to be made, you will be notified at that same time.  1. The lab is open from 7:30am to 5:30 pm Monday-Friday  2. No appointment is necessary  3. Fasting (if needed) is 6-8 hours after food and drink; black coffee  and water are okay   If your symptoms worsen or fail to improve, please contact our office for further instruction, or in case of emergency go directly to the emergency room at the closest medical facility.   General Recommendations:    Please drink plenty of fluids.  Get plenty of rest   Sleep in humidified air  Use saline nasal sprays  Netti pot   OTC Medications:  Decongestants - helps relieve congestion   Flonase (generic fluticasone) or Nasacort (generic triamcinolone) - please make sure to use the "cross-over" technique at a 45 degree angle towards the opposite eye as opposed to straight up the nasal passageway.   Sudafed (generic pseudoephedrine - Note this is the one that is available behind the pharmacy counter); Products with phenylephrine (-PE) may also be used but is often not as effective as pseudoephedrine.   If you have HIGH BLOOD PRESSURE - Coricidin HBP; AVOID any product that is -D as this contains pseudoephedrine which may increase your blood pressure.  Afrin (oxymetazoline) every 6-8 hours for up to 3 days.   Allergies - helps relieve runny nose, itchy eyes and sneezing   Claritin (generic loratidine), Allegra (fexofenidine), or Zyrtec  (generic cyrterizine) for runny nose. These medications should not cause drowsiness.  Note - Benadryl (generic diphenhydramine) may be used however may cause drowsiness  Cough -   Delsym or Robitussin (generic dextromethorphan)  Expectorants - helps loosen mucus to ease removal   Mucinex (generic guaifenesin) as directed on the package.  Headaches / General Aches   Tylenol (generic acetaminophen) - DO NOT EXCEED 3 grams (3,000 mg) in a 24 hour time period  Advil/Motrin (generic ibuprofen)   Sore Throat -   Salt water gargle   Chloraseptic (generic benzocaine) spray or lozenges / Sucrets (generic dyclonine)      Health Maintenance, Male A healthy lifestyle and preventative care can promote health and wellness. 4. Maintain regular health, dental, and eye exams. 5. Eat a healthy diet. Foods like vegetables, fruits, whole grains, low-fat dairy products, and lean protein foods contain the nutrients you need and are low in calories. Decrease your intake of foods high in solid fats, added sugars, and salt. Get information about a proper diet from your health care provider, if necessary. 6. Regular physical exercise is one of the most important things you can do for your health. Most adults should get at least 150 minutes of moderate-intensity exercise (any activity that increases your heart rate and causes you to sweat) each week. In addition, most adults need muscle-strengthening exercises on 2 or more days a week.  7. Maintain a healthy weight. The body mass index (BMI) is a screening tool to identify possible weight problems. It provides  an estimate of body fat based on height and weight. Your health care provider can find your BMI and can help you achieve or maintain a healthy weight. For males 20 years and older: 1. A BMI below 18.5 is considered underweight. 2. A BMI of 18.5 to 24.9 is normal. 3. A BMI of 25 to 29.9 is considered overweight. 4. A BMI of 30 and above is  considered obese. 8. Maintain normal blood lipids and cholesterol by exercising and minimizing your intake of saturated fat. Eat a balanced diet with plenty of fruits and vegetables. Blood tests for lipids and cholesterol should begin at age 29 and be repeated every 5 years. If your lipid or cholesterol levels are high, you are over age 77, or you are at high risk for heart disease, you may need your cholesterol levels checked more frequently.Ongoing high lipid and cholesterol levels should be treated with medicines if diet and exercise are not working. 9. If you smoke, find out from your health care provider how to quit. If you do not use tobacco, do not start. 10. Lung cancer screening is recommended for adults aged 55-80 years who are at high risk for developing lung cancer because of a history of smoking. A yearly low-dose CT scan of the lungs is recommended for people who have at least a 30-pack-year history of smoking and are current smokers or have quit within the past 15 years. A pack year of smoking is smoking an average of 1 pack of cigarettes a day for 1 year (for example, a 30-pack-year history of smoking could mean smoking 1 pack a day for 30 years or 2 packs a day for 15 years). Yearly screening should continue until the smoker has stopped smoking for at least 15 years. Yearly screening should be stopped for people who develop a health problem that would prevent them from having lung cancer treatment. 11. If you choose to drink alcohol, do not have more than 2 drinks per day. One drink is considered to be 12 oz (360 mL) of beer, 5 oz (150 mL) of wine, or 1.5 oz (45 mL) of liquor. 12. Avoid the use of street drugs. Do not share needles with anyone. Ask for help if you need support or instructions about stopping the use of drugs. 13. High blood pressure causes heart disease and increases the risk of stroke. High blood pressure is more likely to develop in: 1. People who have blood pressure in the  end of the normal range (100-139/85-89 mm Hg). 2. People who are overweight or obese. 3. People who are African American. 14. If you are 1-45 years of age, have your blood pressure checked every 3-5 years. If you are 29 years of age or older, have your blood pressure checked every year. You should have your blood pressure measured twice--once when you are at a hospital or clinic, and once when you are not at a hospital or clinic. Record the average of the two measurements. To check your blood pressure when you are not at a hospital or clinic, you can use: 1. An automated blood pressure machine at a pharmacy. 2. A home blood pressure monitor. 15. If you are 80-92 years old, ask your health care provider if you should take aspirin to prevent heart disease. 16. Diabetes screening involves taking a blood sample to check your fasting blood sugar level. This should be done once every 3 years after age 68 if you are at a normal weight and without  risk factors for diabetes. Testing should be considered at a younger age or be carried out more frequently if you are overweight and have at least 1 risk factor for diabetes. 17. Colorectal cancer can be detected and often prevented. Most routine colorectal cancer screening begins at the age of 25 and continues through age 60. However, your health care provider may recommend screening at an earlier age if you have risk factors for colon cancer. On a yearly basis, your health care provider may provide home test kits to check for hidden blood in the stool. A small camera at the end of a tube may be used to directly examine the colon (sigmoidoscopy or colonoscopy) to detect the earliest forms of colorectal cancer. Talk to your health care provider about this at age 31 when routine screening begins. A direct exam of the colon should be repeated every 5-10 years through age 60, unless early forms of precancerous polyps or small growths are found. 18. People who are at an  increased risk for hepatitis B should be screened for this virus. You are considered at high risk for hepatitis B if: 1. You were born in a country where hepatitis B occurs often. Talk with your health care provider about which countries are considered high risk. 2. Your parents were born in a high-risk country and you have not received a shot to protect against hepatitis B (hepatitis B vaccine). 3. You have HIV or AIDS. 4. You use needles to inject street drugs. 5. You live with, or have sex with, someone who has hepatitis B. 6. You are a man who has sex with other men (MSM). 7. You get hemodialysis treatment. 8. You take certain medicines for conditions like cancer, organ transplantation, and autoimmune conditions. 19. Hepatitis C blood testing is recommended for all people born from [birth year] through 1964 and any individual with known risk factors for hepatitis C. 20. Healthy men should no longer receive prostate-specific antigen (PSA) blood tests as part of routine cancer screening. Talk to your health care provider about prostate cancer screening. 21. Testicular cancer screening is not recommended for adolescents or adult males who have no symptoms. Screening includes self-exam, a health care provider exam, and other screening tests. Consult with your health care provider about any symptoms you have or any concerns you have about testicular cancer. 22. Practice safe sex. Use condoms and avoid high-risk sexual practices to reduce the spread of sexually transmitted infections (STIs). 23. You should be screened for STIs, including gonorrhea and chlamydia if: 1. You are sexually active and are younger than 24 years. 2. You are older than 24 years, and your health care provider tells you that you are at risk for this type of infection. 3. Your sexual activity has changed since you were last screened, and you are at an increased risk for chlamydia or gonorrhea. Ask your health care provider if you are at  risk. 24. If you are at risk of being infected with HIV, it is recommended that you take a prescription medicine daily to prevent HIV infection. This is called pre-exposure prophylaxis (PrEP). You are considered at risk if: 1. You are a man who has sex with other men (MSM). 2. You are a heterosexual man who is sexually active with multiple partners. 3. You take drugs by injection. 4. You are sexually active with a partner who has HIV. 5. Talk with your health care provider about whether you are at high risk of being infected with HIV. If  you choose to begin PrEP, you should first be tested for HIV. You should then be tested every 3 months for as long as you are taking PrEP. 25. Use sunscreen. Apply sunscreen liberally and repeatedly throughout the day. You should seek shade when your shadow is shorter than you. Protect yourself by wearing long sleeves, pants, a wide-brimmed hat, and sunglasses year round whenever you are outdoors. 26. Tell your health care provider of new moles or changes in moles, especially if there is a change in shape or color. Also, tell your health care provider if a mole is larger than the size of a pencil eraser. 27. A one-time screening for abdominal aortic aneurysm (AAA) and surgical repair of large AAAs by ultrasound is recommended for men aged 65-75 years who are current or former smokers. 28. Stay current with your vaccines (immunizations).   This information is not intended to replace advice given to you by your health care provider. Make sure you discuss any questions you have with your health care provider.   Document Released: 05/23/2008 Document Revised: 12/16/2014 Document Reviewed: 04/22/2011 Elsevier Interactive Patient Education [employer]

## 2016-06-25 NOTE — Progress Notes (Signed)
Pre visit review using our clinic review tool, if applicable. No additional management support is needed unless otherwise documented below in the visit note. 

## 2016-06-26 ENCOUNTER — Encounter: Payer: Self-pay | Admitting: Family

## 2016-07-19 ENCOUNTER — Other Ambulatory Visit (INDEPENDENT_AMBULATORY_CARE_PROVIDER_SITE_OTHER): Payer: Commercial Managed Care - PPO

## 2016-07-19 DIAGNOSIS — Z0000 Encounter for general adult medical examination without abnormal findings: Secondary | ICD-10-CM | POA: Diagnosis not present

## 2016-07-19 LAB — LIPID PANEL
CHOL/HDL RATIO: 4
Cholesterol: 228 mg/dL — ABNORMAL HIGH (ref 0–200)
HDL: 59.5 mg/dL (ref >39.00)
LDL CALC: 145 mg/dL — AB (ref 0–99)
NONHDL: 168.43
Triglycerides: 118 mg/dL (ref 0.0–149.0)
VLDL: 23.6 mg/dL (ref 0.0–40.0)

## 2016-07-19 LAB — CBC
HEMATOCRIT: 45.7 % (ref 39.0–52.0)
HEMOGLOBIN: 15.8 g/dL (ref 13.0–17.0)
MCHC: 34.5 g/dL (ref 30.0–36.0)
MCV: 87.9 fl (ref 78.0–100.0)
Platelets: 257 10*3/uL (ref 150.0–400.0)
RBC: 5.2 Mil/uL (ref 4.22–5.81)
RDW: 13.3 % (ref 11.5–15.5)
WBC: 8.6 10*3/uL (ref 4.0–10.5)

## 2016-07-19 LAB — COMPREHENSIVE METABOLIC PANEL
ALT: 78 U/L — ABNORMAL HIGH (ref 0–53)
AST: 37 U/L (ref 0–37)
Albumin: 4.4 g/dL (ref 3.5–5.2)
Alkaline Phosphatase: 104 U/L (ref 39–117)
BILIRUBIN TOTAL: 0.7 mg/dL (ref 0.2–1.2)
BUN: 12 mg/dL (ref 6–23)
CALCIUM: 10 mg/dL (ref 8.4–10.5)
CHLORIDE: 102 meq/L (ref 96–112)
CO2: 34 meq/L — AB (ref 19–32)
CREATININE: 1.14 mg/dL (ref 0.40–1.50)
GFR: 74.22 mL/min (ref >60.00)
GLUCOSE: 94 mg/dL (ref 70–99)
Potassium: 4.7 mEq/L (ref 3.5–5.1)
SODIUM: 140 meq/L (ref 135–145)
TOTAL PROTEIN: 7.4 g/dL (ref 6.0–8.3)

## 2016-07-21 ENCOUNTER — Encounter: Payer: Self-pay | Admitting: Family

## 2017-02-05 IMAGING — DX DG CHEST 2V
2 series · 2 of 2 positions shown · non-contrast
Comparison: 04/11/2009.

CLINICAL DATA: Nonproductive cough.

EXAM:
CHEST  2 VIEW

[chest pa]
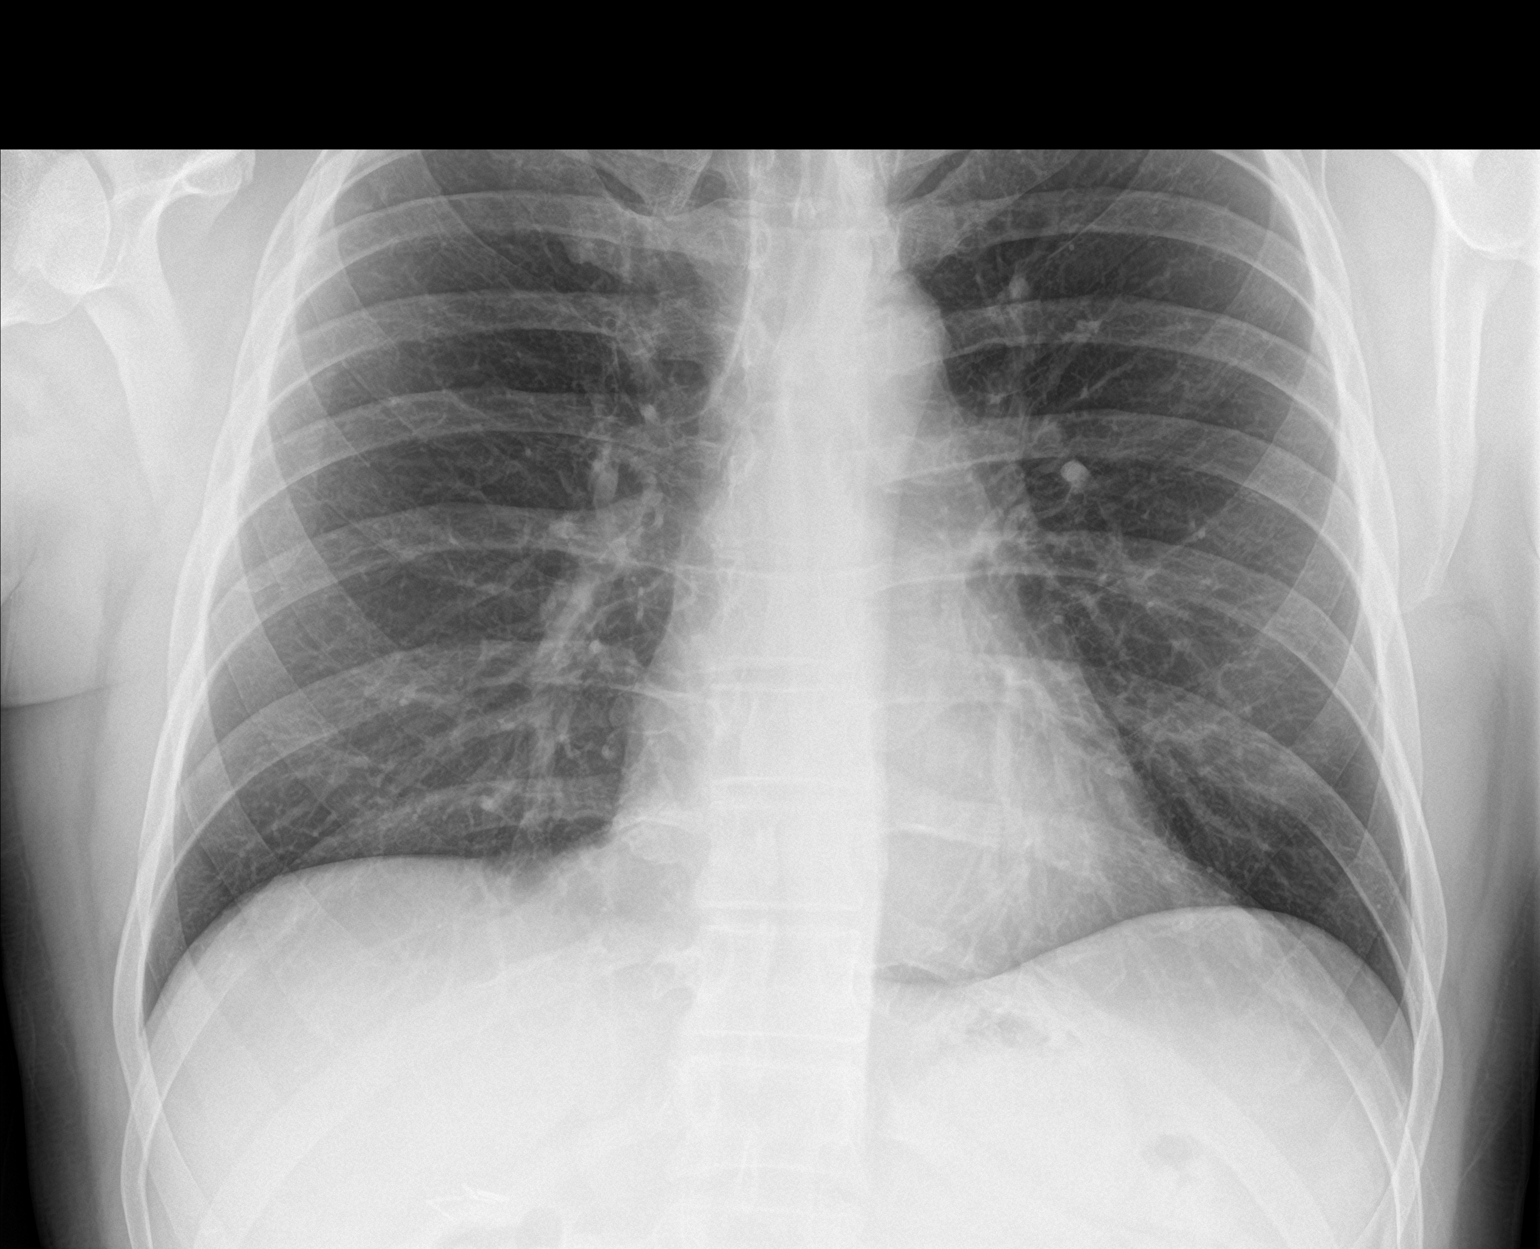

[chest lat]
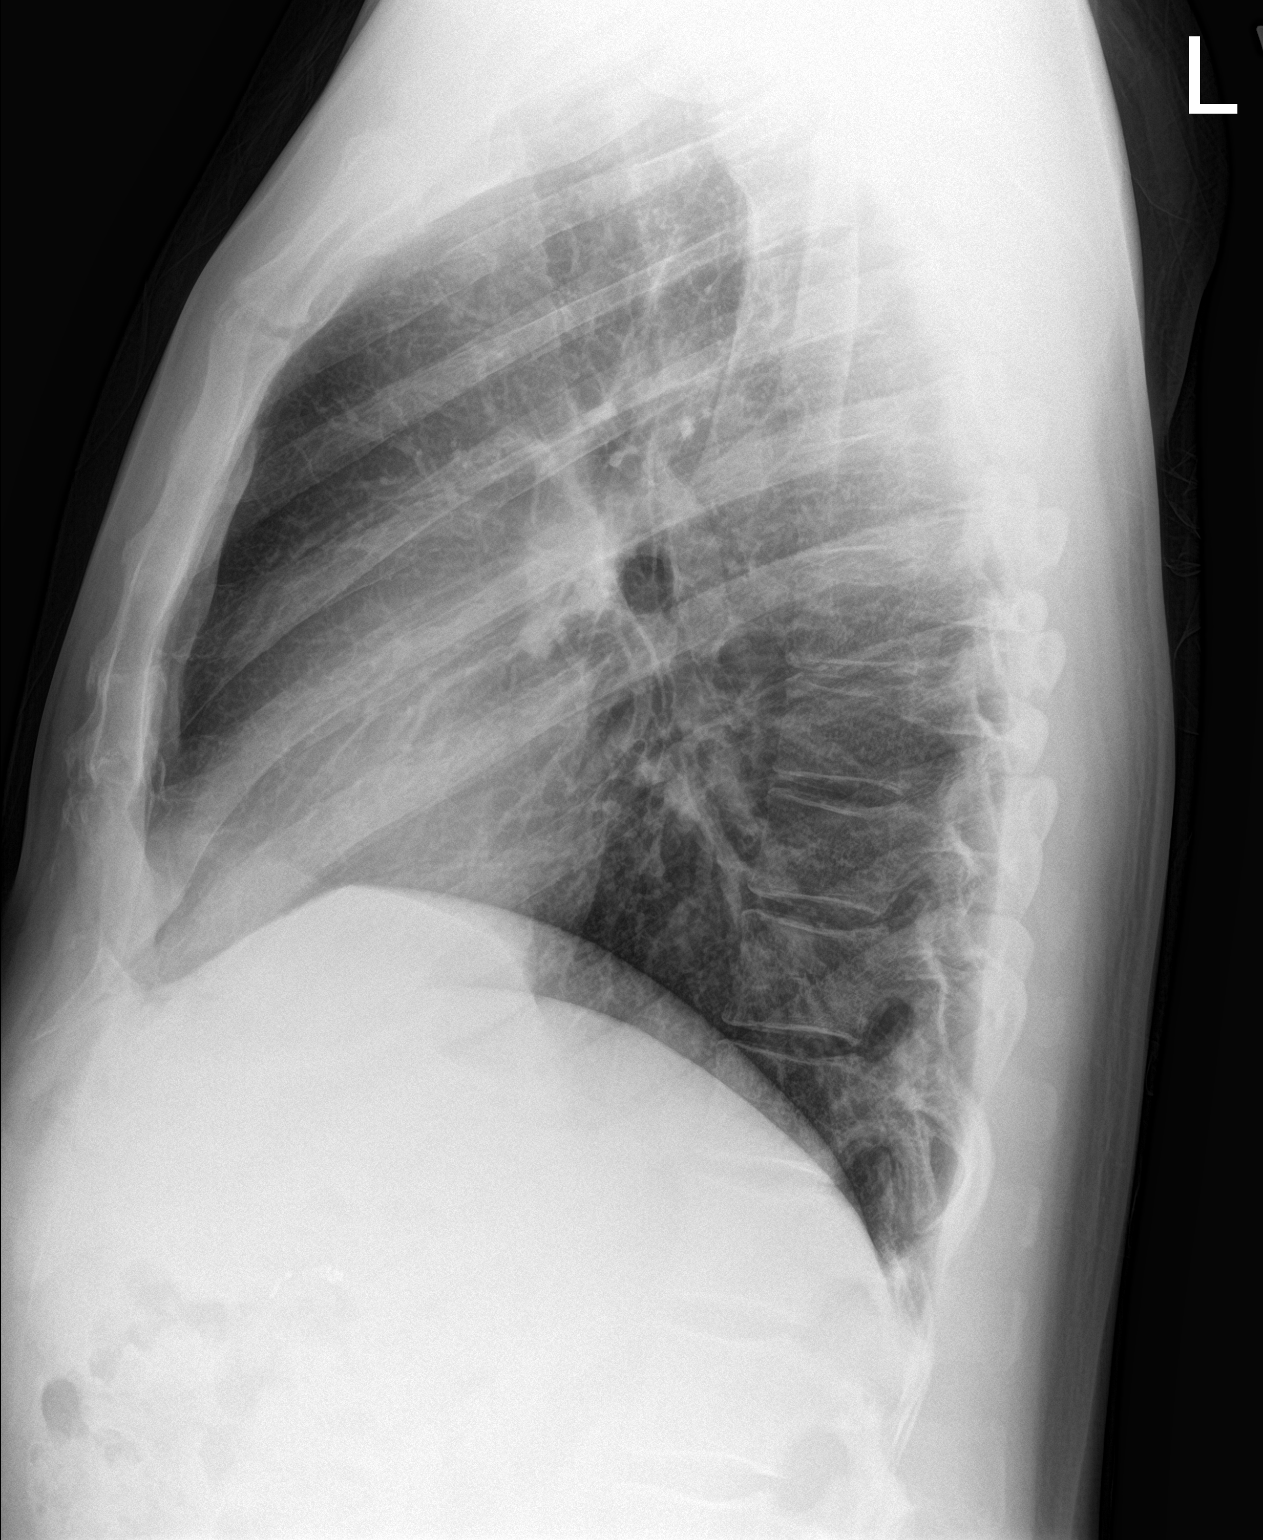

[2 of 2 positions shown; findings below may reference images not displayed]

FINDINGS: Mediastinum and hilar structures normal. Heart size normal. Lungs
are clear. No focal infiltrate. No pleural effusion or pneumothorax.
No acute bony abnormality .
IMPRESSION: No acute cardiopulmonary disease.

## 2021-12-31 DIAGNOSIS — D225 Melanocytic nevi of trunk: Secondary | ICD-10-CM | POA: Diagnosis not present

## 2021-12-31 DIAGNOSIS — L578 Other skin changes due to chronic exposure to nonionizing radiation: Secondary | ICD-10-CM | POA: Diagnosis not present

## 2021-12-31 DIAGNOSIS — L821 Other seborrheic keratosis: Secondary | ICD-10-CM | POA: Diagnosis not present

## 2021-12-31 DIAGNOSIS — L820 Inflamed seborrheic keratosis: Secondary | ICD-10-CM | POA: Diagnosis not present

## 2022-01-24 DIAGNOSIS — J3489 Other specified disorders of nose and nasal sinuses: Secondary | ICD-10-CM | POA: Diagnosis not present

## 2023-01-07 ENCOUNTER — Ambulatory Visit: Payer: BC Managed Care – PPO | Admitting: Family Medicine

## 2023-01-07 ENCOUNTER — Encounter: Payer: Self-pay | Admitting: Family Medicine

## 2023-01-07 VITALS — BP 124/80 | HR 83 | Temp 97.4°F | Ht 75.0 in | Wt 224.8 lb

## 2023-01-07 DIAGNOSIS — Z0000 Encounter for general adult medical examination without abnormal findings: Secondary | ICD-10-CM

## 2023-01-07 DIAGNOSIS — L578 Other skin changes due to chronic exposure to nonionizing radiation: Secondary | ICD-10-CM | POA: Diagnosis not present

## 2023-01-07 DIAGNOSIS — H547 Unspecified visual loss: Secondary | ICD-10-CM | POA: Diagnosis not present

## 2023-01-07 DIAGNOSIS — K219 Gastro-esophageal reflux disease without esophagitis: Secondary | ICD-10-CM

## 2023-01-07 DIAGNOSIS — B360 Pityriasis versicolor: Secondary | ICD-10-CM | POA: Diagnosis not present

## 2023-01-07 DIAGNOSIS — R7989 Other specified abnormal findings of blood chemistry: Secondary | ICD-10-CM

## 2023-01-07 DIAGNOSIS — D225 Melanocytic nevi of trunk: Secondary | ICD-10-CM | POA: Diagnosis not present

## 2023-01-07 LAB — CBC WITH DIFFERENTIAL/PLATELET
Basophils Absolute: 0 10*3/uL (ref 0.0–0.1)
Basophils Relative: 0.7 % (ref 0.0–3.0)
Eosinophils Absolute: 0.3 10*3/uL (ref 0.0–0.7)
Eosinophils Relative: 5.1 % — ABNORMAL HIGH (ref 0.0–5.0)
HCT: 46.4 % (ref 39.0–52.0)
Hemoglobin: 15.9 g/dL (ref 13.0–17.0)
Lymphocytes Relative: 25.6 % (ref 12.0–46.0)
Lymphs Abs: 1.7 10*3/uL (ref 0.7–4.0)
MCHC: 34.2 g/dL (ref 30.0–36.0)
MCV: 88.5 fl (ref 78.0–100.0)
Monocytes Absolute: 0.6 10*3/uL (ref 0.1–1.0)
Monocytes Relative: 8.5 % (ref 3.0–12.0)
Neutro Abs: 4.1 10*3/uL (ref 1.4–7.7)
Neutrophils Relative %: 60.1 % (ref 43.0–77.0)
Platelets: 236 10*3/uL (ref 150.0–400.0)
RBC: 5.25 Mil/uL (ref 4.22–5.81)
RDW: 13.2 % (ref 11.5–15.5)
WBC: 6.8 10*3/uL (ref 4.0–10.5)

## 2023-01-07 LAB — URINALYSIS, ROUTINE W REFLEX MICROSCOPIC
Hgb urine dipstick: NEGATIVE
Ketones, ur: NEGATIVE
Nitrite: NEGATIVE
Specific Gravity, Urine: >=1.03 — AB (ref 1.000–1.030)
Total Protein, Urine: NEGATIVE
Urine Glucose: NEGATIVE
Urobilinogen, UA: 0.2 (ref 0.0–1.0)
pH: 6 (ref 5.0–8.0)

## 2023-01-07 LAB — BASIC METABOLIC PANEL
BUN: 11 mg/dL (ref 6–23)
CO2: 28 mEq/L (ref 19–32)
Calcium: 9.4 mg/dL (ref 8.4–10.5)
Chloride: 104 mEq/L (ref 96–112)
Creatinine, Ser: 1.03 mg/dL (ref 0.40–1.50)
GFR: 84.82 mL/min (ref >60.00)
Glucose, Bld: 97 mg/dL (ref 70–99)
Potassium: 4.5 mEq/L (ref 3.5–5.1)
Sodium: 140 mEq/L (ref 135–145)

## 2023-01-07 LAB — LIPID PANEL
Cholesterol: 249 mg/dL — ABNORMAL HIGH (ref 0–200)
HDL: 56.1 mg/dL (ref >39.00)
LDL Cholesterol: 157 mg/dL — ABNORMAL HIGH (ref 0–99)
NonHDL: 193.24
Total CHOL/HDL Ratio: 4
Triglycerides: 180 mg/dL — ABNORMAL HIGH (ref 0.0–149.0)
VLDL: 36 mg/dL (ref 0.0–40.0)

## 2023-01-07 LAB — HEPATIC FUNCTION PANEL
ALT: 103 U/L — ABNORMAL HIGH (ref 0–53)
AST: 57 U/L — ABNORMAL HIGH (ref 0–37)
Albumin: 4.6 g/dL (ref 3.5–5.2)
Alkaline Phosphatase: 128 U/L — ABNORMAL HIGH (ref 39–117)
Bilirubin, Direct: 0.1 mg/dL (ref 0.0–0.3)
Total Bilirubin: 0.7 mg/dL (ref 0.2–1.2)
Total Protein: 7.4 g/dL (ref 6.0–8.3)

## 2023-01-07 LAB — HEMOGLOBIN A1C: Hgb A1c MFr Bld: 5.4 % (ref 4.6–6.5)

## 2023-01-07 LAB — PSA: PSA: 1.15 ng/mL (ref 0.10–4.00)

## 2023-01-07 NOTE — Progress Notes (Addendum)
New Patient Office Visit  Subjective    Patient ID: Samuel Bridges, male    DOB: 1971-09-14  Age: 52 y.o. MRN: [id number]  CC:  Chief Complaint  Patient presents with   Establish Care    New patient, no concerns. Patient fasting.     HPI Rico L Dalzell presents to establish care. Encounter Diagnoses  Name Primary?   Healthcare maintenance Yes   Gastroesophageal reflux disease, unspecified whether esophagitis present    Vision decreased    Elevated LFTs    He is here for physical exam.  He is fasting.  He is healthy as far as he knows.  He is not exercising regularly at this point.  He does have regular dental care.  He is married with 2 sons.  Both sons will be playing baseball at [place].  He does have a history of elevated cholesterol with fatty liver disease.  He has hereto for been controlling this with diet and exercise.  He admits that he is at a low point with his diet but he is motivated to improve it.  Ongoing history of GERD that comes and goes since he was age 45.  He has experienced a significant decline in his near intermediate and far vision since age 43.  Strong family history of skin cancer.  He does have regular follow-up with a dermatologist.  Outpatient Encounter Medications as of 01/07/2023  Medication Sig   [DISCONTINUED] fluticasone furoate-vilanterol (BREO ELLIPTA) 100-25 MCG/INH AEPB Inhale 1 puff into the lungs daily.   No facility-administered encounter medications on file as of 01/07/2023.    Past Medical History:  Diagnosis Date   Allergy    Chicken pox    History of shingles    Skin lesions, generalized    close surveillance with family history of melanoma   Vaso vagal episode    had full cardiology evaluation for exertional symptoms: negative Echo, stress-test    Past Surgical History:  Procedure Laterality Date   APPENDECTOMY     '97   CHOLECYSTECTOMY     '10 acute; laproscopic   EYE SURGERY     correction of stabismus - adjustable  suture technique   SHOULDER ARTHROSCOPY W/ ROTATOR CUFF REPAIR     right shoulde  2010    Family History  Problem Relation Age of Onset   Cancer Mother        lung cancer   Hypertension Mother    Cancer Father        lung   Basal cell carcinoma Father        aggressive - required radical neck dissection   Melanoma Sister    Basal cell carcinoma Sister    Cancer Paternal Grandfather        lung cancer   Cancer Other        lung cancer   Diabetes Neg Hx    Early death Neg Hx    COPD Neg Hx     Social History   Socioeconomic History   Marital status: Married    Spouse name: Not on file   Number of children: 3   Years of education: 16   Highest education level: Not on file  Occupational History   Occupation: businessman    Comment: Gen' mgr for [employer]  Tobacco Use   Smoking status: Never   Smokeless tobacco: Never  Vaping Use   Vaping Use: Never used  Substance and Sexual Activity   Alcohol use:  Yes    Comment: rare   Drug use: No   Sexual activity: Yes    Partners: Female  Other Topics Concern   Not on file  Social History Narrative   [employer] BS acctg; Married '02.  2 sons - '03, '06; 1 dtr - '09. 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 No history of abuse. Marriage is in excellent health. 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   HENT: Negative.    Eyes:  Negative for blurred vision, discharge and redness.  Respiratory: Negative.    Cardiovascular: Negative.   Gastrointestinal:  Negative for abdominal pain.  Genitourinary: Negative.   Musculoskeletal: Negative.  Negative for myalgias.  Skin:   Negative for rash.  Neurological:  Negative for tingling, loss of consciousness and weakness.  Endo/Heme/Allergies:  Negative for polydipsia.        Objective    BP 124/80 (BP Location: Left Arm, Patient Position: Sitting, Cuff Size: Large)   Pulse 83   Temp (!) 97.4 F (36.3 C) (Temporal)   Ht 6\' 3"  (1.905 m)   Wt 224 lb 12.8 oz (102 kg)   SpO2 96%   BMI 28.10 kg/m   Physical Exam Constitutional:      General: He is not in acute distress.    Appearance: Normal appearance. He is not ill-appearing, toxic-appearing or diaphoretic.  HENT:     Head: Normocephalic and atraumatic.     Right Ear: External ear normal.     Left Ear: External ear normal.     Mouth/Throat:     Mouth: Mucous membranes are moist.     Pharynx: Oropharynx is clear. No oropharyngeal exudate or posterior oropharyngeal erythema.  Eyes:     General: No scleral icterus.       Right eye: No discharge.        Left eye: No discharge.     Extraocular Movements: Extraocular movements intact.     Conjunctiva/sclera: Conjunctivae normal.     Pupils: Pupils are equal, round, and reactive to light.  Cardiovascular:     Rate and Rhythm: Normal rate and regular rhythm.  Pulmonary:     Effort: Pulmonary effort is normal. No respiratory distress.     Breath sounds: Normal breath sounds.  Abdominal:     General: Bowel sounds are normal. There is no distension.     Tenderness: There is no abdominal tenderness. There is no guarding.     Hernia: No hernia is present. There is no hernia in the left inguinal area or right inguinal area.  Genitourinary:    Penis: Circumcised. No hypospadias, erythema, tenderness, discharge, swelling or lesions.      Testes:        Right: Mass, tenderness or swelling not present. Right testis is descended.        Left: Mass or swelling not present. Left testis is descended.     Epididymis:     Right: Not inflamed or enlarged.     Left: Not inflamed or enlarged.     Prostate: Enlarged.  Not tender and no nodules present.     Rectum: Guaiac result negative. No  mass, tenderness, anal fissure, external hemorrhoid or internal hemorrhoid. Normal anal tone.  Musculoskeletal:     Cervical back: No rigidity or tenderness.  Lymphadenopathy:     Lower Body: No right inguinal adenopathy. No left inguinal adenopathy.  Skin:    General: Skin is warm and dry.  Neurological:     Mental Status: He is alert and oriented to person, place, and time.  Psychiatric:        Mood and Affect: Mood normal.        Behavior: Behavior normal.         Assessment & Plan:   Healthcare maintenance -     PSA -     CBC with Differential/Platelet -     Hemoglobin A1c -     Lipid panel -     Urinalysis, Routine w reflex microscopic -     Ambulatory referral to Gastroenterology -     Basic metabolic panel -     Hepatic function panel  Gastroesophageal reflux disease, unspecified whether esophagitis present -     Ambulatory referral to Gastroenterology  Vision decreased -     Ambulatory referral to Ophthalmology  Elevated LFTs -     US ABDOMEN LIMITED RUQ (LIVER/GB); Future     Return in about 6 months (around 07/08/2023).   Information was given on health maintenance and disease prevention.  He was also given information on hepatic steatosis.  He prefers weight loss and dietary control of the steatosis and elevated cholesterol that he has had in the past.  He is motivated.  Libby Maw, MD

## 2023-01-07 NOTE — Addendum Note (Signed)
Addended by: Jon Billings on: 01/07/2023 04:36 PM   Modules accepted: Orders

## 2023-01-09 DIAGNOSIS — H5203 Hypermetropia, bilateral: Secondary | ICD-10-CM | POA: Diagnosis not present

## 2023-01-09 DIAGNOSIS — H524 Presbyopia: Secondary | ICD-10-CM | POA: Diagnosis not present

## 2023-01-09 DIAGNOSIS — H43811 Vitreous degeneration, right eye: Secondary | ICD-10-CM | POA: Diagnosis not present

## 2023-01-09 DIAGNOSIS — H2513 Age-related nuclear cataract, bilateral: Secondary | ICD-10-CM | POA: Diagnosis not present

## 2023-01-20 ENCOUNTER — Ambulatory Visit
Admission: RE | Admit: 2023-01-20 | Discharge: 2023-01-20 | Disposition: A | Discharge status: Home / Self Care | Payer: BC Managed Care – PPO | Source: Ambulatory Visit | Attending: Family Medicine | Admitting: Family Medicine

## 2023-01-20 DIAGNOSIS — R7989 Other specified abnormal findings of blood chemistry: Secondary | ICD-10-CM

## 2023-01-20 DIAGNOSIS — Z9049 Acquired absence of other specified parts of digestive tract: Secondary | ICD-10-CM | POA: Diagnosis not present

## 2023-01-20 DIAGNOSIS — R945 Abnormal results of liver function studies: Secondary | ICD-10-CM | POA: Diagnosis not present

## 2023-06-16 ENCOUNTER — Telehealth: Payer: Self-pay | Admitting: Family Medicine

## 2023-06-16 NOTE — Telephone Encounter (Signed)
error 

## 2023-07-08 ENCOUNTER — Ambulatory Visit: Payer: BC Managed Care – PPO | Admitting: Family Medicine

## 2023-11-08 DIAGNOSIS — R059 Cough, unspecified: Secondary | ICD-10-CM | POA: Diagnosis not present

## 2023-11-08 DIAGNOSIS — R0981 Nasal congestion: Secondary | ICD-10-CM | POA: Diagnosis not present

## 2023-11-08 DIAGNOSIS — J208 Acute bronchitis due to other specified organisms: Secondary | ICD-10-CM | POA: Diagnosis not present

## 2023-11-08 DIAGNOSIS — B9689 Other specified bacterial agents as the cause of diseases classified elsewhere: Secondary | ICD-10-CM | POA: Diagnosis not present

## 2023-11-08 DIAGNOSIS — J019 Acute sinusitis, unspecified: Secondary | ICD-10-CM | POA: Diagnosis not present

## 2024-01-21 DIAGNOSIS — H524 Presbyopia: Secondary | ICD-10-CM | POA: Diagnosis not present

## 2024-01-21 DIAGNOSIS — H5203 Hypermetropia, bilateral: Secondary | ICD-10-CM | POA: Diagnosis not present

## 2024-01-21 DIAGNOSIS — H2513 Age-related nuclear cataract, bilateral: Secondary | ICD-10-CM | POA: Diagnosis not present

## 2024-01-21 DIAGNOSIS — H43811 Vitreous degeneration, right eye: Secondary | ICD-10-CM | POA: Diagnosis not present

## 2024-04-28 DIAGNOSIS — J069 Acute upper respiratory infection, unspecified: Secondary | ICD-10-CM | POA: Diagnosis not present

## 2024-04-28 DIAGNOSIS — R059 Cough, unspecified: Secondary | ICD-10-CM | POA: Diagnosis not present

## 2024-04-28 DIAGNOSIS — R0981 Nasal congestion: Secondary | ICD-10-CM | POA: Diagnosis not present

## 2024-05-04 ENCOUNTER — Ambulatory Visit: Admitting: Nurse Practitioner

## 2024-05-04 ENCOUNTER — Ambulatory Visit: Payer: Self-pay

## 2024-05-04 VITALS — BP 124/88 | HR 86 | Temp 97.1°F | Ht 75.0 in | Wt 226.8 lb

## 2024-05-04 DIAGNOSIS — J0140 Acute pansinusitis, unspecified: Secondary | ICD-10-CM | POA: Diagnosis not present

## 2024-05-04 DIAGNOSIS — G589 Mononeuropathy, unspecified: Secondary | ICD-10-CM | POA: Diagnosis not present

## 2024-05-04 MED ORDER — AMOXICILLIN-POT CLAVULANATE 875-125 MG PO TABS: 1.0000 | ORAL_TABLET | Freq: Two times a day (BID) | ORAL | 0 refills | Status: DC

## 2024-05-04 MED ORDER — PREDNISONE 20 MG PO TABS: 40.0000 mg | ORAL_TABLET | Freq: Every day | ORAL | 0 refills | Status: DC

## 2024-05-04 NOTE — Telephone Encounter (Signed)
 Noted. Patient scheduled with an in office appointment by Nurse Triage.

## 2024-05-04 NOTE — Patient Instructions (Signed)
 It was great to see you!  Start prednisone 2 tablets daily with food in the morning  Start augmentin twice a day for 10 day  Start mucinex twice a day to break up the congestion   Drink plenty of fluids  Let's follow-up if symptoms worsen or don't improve   Take care,  Rheba Cedar, NP

## 2024-05-04 NOTE — Assessment & Plan Note (Signed)
 Chronic sinus issues have worsened to acute sinusitis with a bacterial infection, presenting with facial pressure, sore throat, and a productive cough with green mucus. Tests for strep and COVID are negative. Lungs are clear, and there is no fever. Start Augmentin twice daily for ten days, Mucinex twice daily, and prednisone, 40mg  once daily in the morning with food for five days. Encourage fluid intake.

## 2024-05-04 NOTE — Progress Notes (Signed)
 Acute Office Visit  Subjective:     Patient ID: Samuel Bridges, male    DOB: 1971/02/27, 53 y.o.   MRN: [id number]  Chief Complaint  Patient presents with   Cough    With chest congestion, sinus drainage, productive cough with thick green mucous, stabbing pain under shoulder blades for 2 months, SOB-went to [facility] last Thursday, concerns that it happened once-shooting pain from groin area down leg    HPI Discussed the use of AI scribe software for clinical note transcription with the patient, who gave verbal consent to proceed.  History of Present Illness   Samuel Bridges "Merlyn Starring" is a 53 year old male who presents with persistent cough and chest tightness.  He has experienced allergy and sinus issues for the past couple of months, often resulting in sinus infections. Five days ago, symptoms progressed to the chest, causing a productive cough with green, thick mucus, chest tightness, and a burning sensation when coughing. There is no fever. He tested negative for strep and COVID-19 at urgent care. Tessalon perles and inhaler provided no relief. He reports a complete loss of energy, with difficulty walking up stairs and getting out of bed.  He has a history of double pneumonia in 2013, which severely impacted his mobility at the time. He notes a sharp pain under his shoulder blade for the past couple of months, uncertain if it relates to current symptoms. He takes Advil Cold and Sinus once daily, particularly before bed, which may contribute to morning productive cough. His left ear has been draining fluid, attributed to the sinus infection.  Approximately ten days ago, he experienced a sudden, sharp shooting pain down his left leg and once down his right leg, described as radiating from the groin down the leg, similar to a 'pinched nerve.' This pain has not recurred, and there is no leg weakness.      ROS See pertinent positives and negatives per HPI.     Objective:    BP 124/88  (BP Location: Left Arm, Patient Position: Sitting, Cuff Size: Normal)   Pulse 86   Temp (!) 97.1 F (36.2 C)   Ht 6\' 3"  (1.905 m)   Wt 226 lb 12.8 oz (102.9 kg)   SpO2 99%   BMI 28.35 kg/m    Physical Exam Vitals and nursing note reviewed.  Constitutional:      Appearance: Normal appearance.  HENT:     Head: Normocephalic.     Right Ear: Tympanic membrane, ear canal and external ear normal.     Left Ear: Tympanic membrane, ear canal and external ear normal.     Nose:     Right Sinus: Maxillary sinus tenderness and frontal sinus tenderness present.     Left Sinus: Maxillary sinus tenderness and frontal sinus tenderness present.     Mouth/Throat:     Mouth: Mucous membranes are moist.     Pharynx: Posterior oropharyngeal erythema present. No oropharyngeal exudate.  Eyes:     Conjunctiva/sclera: Conjunctivae normal.  Cardiovascular:     Rate and Rhythm: Normal rate and regular rhythm.     Pulses: Normal pulses.     Heart sounds: Normal heart sounds.  Pulmonary:     Effort: Pulmonary effort is normal.     Breath sounds: Normal breath sounds.  Musculoskeletal:     Cervical back: Normal range of motion and neck supple. No tenderness.  Lymphadenopathy:     Cervical: No cervical adenopathy.  Skin:  General: Skin is warm.  Neurological:     General: No focal deficit present.     Mental Status: He is alert and oriented to person, place, and time.  Psychiatric:        Mood and Affect: Mood normal.        Behavior: Behavior normal.        Thought Content: Thought content normal.        Judgment: Judgment normal.      Assessment & Plan:   Problem List Items Addressed This Visit       Respiratory   Acute non-recurrent pansinusitis - Primary   Chronic sinus issues have worsened to acute sinusitis with a bacterial infection, presenting with facial pressure, sore throat, and a productive cough with green mucus. Tests for strep and COVID are negative. Lungs are clear, and  there is no fever. Start Augmentin twice daily for ten days, Mucinex twice daily, and prednisone, 40mg  once daily in the morning with food for five days. Encourage fluid intake.        Relevant Medications   benzonatate (TESSALON) 100 MG capsule   predniSONE (DELTASONE) 20 MG tablet   amoxicillin-clavulanate (AUGMENTIN) 875-125 MG tablet   Other Visit Diagnoses       Pinched nerve       Pinched nerve pain radiating down leg 10 days ago. No recurring symptoms. Recommend regular stretching       Meds ordered this encounter  Medications   predniSONE (DELTASONE) 20 MG tablet    Sig: Take 2 tablets (40 mg total) by mouth daily with breakfast.    Dispense:  10 tablet    Refill:  0   amoxicillin-clavulanate (AUGMENTIN) 875-125 MG tablet    Sig: Take 1 tablet by mouth 2 (two) times daily.    Dispense:  20 tablet    Refill:  0    Return if symptoms worsen or fail to improve.  Odette Benjamin, NP

## 2024-05-04 NOTE — Telephone Encounter (Signed)
  Chief Complaint: Back pain with coughing, chest tightness Symptoms: see above Frequency: ongoing for 5 days Pertinent Negatives: Patient denies known fever Disposition: [] ED /[] Urgent Care (no appt availability in office) / [x] Appointment(In office/virtual)/ []  [facility]/ [] Home Care/ [] Refused Recommended Disposition /[] [facility]/ []  Follow-up with PCP Additional Notes: Patient called in to make appt for evaluation. Patient has had a bad cough with green phlegm, chest tightness and mild shortness of breath with cough, wheezing, and fatigue for 5 days.    Copied from CRM [id number]. Topic: Clinical - Red Word Triage >> May 04, 2024 10:54 AM [employer] wrote: Red Word that prompted transfer to Nurse Triage: trouble brearthing, back pain. Reason for Disposition  SEVERE coughing spells (e.g., whooping sound after coughing, vomiting after coughing)  Answer Assessment - Initial Assessment Questions 1. ONSET: "When did the cough begin?"      5 days ago 2. SEVERITY: "How bad is the cough today?"      Coughing up green phlegm 3. SPUTUM: "Describe the color of your sputum" (none, dry cough; clear, white, yellow, green)     green 4. HEMOPTYSIS: "Are you coughing up any blood?" If so ask: "How much?" (flecks, streaks, tablespoons, etc.)     No 5. DIFFICULTY BREATHING: "Are you having difficulty breathing?" If Yes, ask: "How bad is it?" (e.g., mild, moderate, severe)    - MILD: No SOB at rest, mild SOB with walking, speaks normally in sentences, can lie down, no retractions, pulse < 100.    - MODERATE: SOB at rest, SOB with minimal exertion and prefers to sit, cannot lie down flat, speaks in phrases, mild retractions, audible wheezing, pulse 100-120.    - SEVERE: Very SOB at rest, speaks in single words, struggling to breathe, sitting hunched forward, retractions, pulse > 120      Moderate difficulty breathing 6. FEVER: "Do you have a fever?" If Yes, ask: "What is your  temperature, how was it measured, and when did it start?"     Unsure 7. CARDIAC HISTORY: "Do you have any history of heart disease?" (e.g., heart attack, congestive heart failure)      No 8. LUNG HISTORY: "Do you have any history of lung disease?"  (e.g., pulmonary embolus, asthma, emphysema)     No 9. PE RISK FACTORS: "Do you have a history of blood clots?" (or: recent major surgery, recent prolonged travel, bedridden)     No 10. OTHER SYMPTOMS: "Do you have any other symptoms?" (e.g., runny nose, wheezing, chest pain)       Weakness, fatigue, coughing green phlegm, back pain  Protocols used: Cough - Acute Productive-A-AH

## 2024-05-10 ENCOUNTER — Encounter: Admitting: Family Medicine

## 2024-05-17 ENCOUNTER — Ambulatory Visit (INDEPENDENT_AMBULATORY_CARE_PROVIDER_SITE_OTHER): Admitting: Family Medicine

## 2024-05-17 ENCOUNTER — Encounter: Payer: Self-pay | Admitting: Family Medicine

## 2024-05-17 VITALS — BP 110/80 | HR 75 | Temp 97.3°F | Ht 75.0 in | Wt 228.8 lb

## 2024-05-17 DIAGNOSIS — Z125 Encounter for screening for malignant neoplasm of prostate: Secondary | ICD-10-CM | POA: Diagnosis not present

## 2024-05-17 DIAGNOSIS — K760 Fatty (change of) liver, not elsewhere classified: Secondary | ICD-10-CM

## 2024-05-17 DIAGNOSIS — Z114 Encounter for screening for human immunodeficiency virus [HIV]: Secondary | ICD-10-CM | POA: Diagnosis not present

## 2024-05-17 DIAGNOSIS — Z1211 Encounter for screening for malignant neoplasm of colon: Secondary | ICD-10-CM

## 2024-05-17 DIAGNOSIS — E781 Pure hyperglyceridemia: Secondary | ICD-10-CM

## 2024-05-17 DIAGNOSIS — Z13220 Encounter for screening for lipoid disorders: Secondary | ICD-10-CM | POA: Diagnosis not present

## 2024-05-17 DIAGNOSIS — E663 Overweight: Secondary | ICD-10-CM

## 2024-05-17 DIAGNOSIS — Z1159 Encounter for screening for other viral diseases: Secondary | ICD-10-CM

## 2024-05-17 DIAGNOSIS — Z23 Encounter for immunization: Secondary | ICD-10-CM | POA: Diagnosis not present

## 2024-05-17 DIAGNOSIS — Z0000 Encounter for general adult medical examination without abnormal findings: Secondary | ICD-10-CM

## 2024-05-17 DIAGNOSIS — Z0001 Encounter for general adult medical examination with abnormal findings: Secondary | ICD-10-CM

## 2024-05-17 DIAGNOSIS — Z131 Encounter for screening for diabetes mellitus: Secondary | ICD-10-CM

## 2024-05-17 LAB — COMPREHENSIVE METABOLIC PANEL WITH GFR
ALT: 109 U/L — ABNORMAL HIGH (ref 0–53)
AST: 42 U/L — ABNORMAL HIGH (ref 0–37)
Albumin: 4.4 g/dL (ref 3.5–5.2)
Alkaline Phosphatase: 132 U/L — ABNORMAL HIGH (ref 39–117)
BUN: 14 mg/dL (ref 6–23)
CO2: 25 meq/L (ref 19–32)
Calcium: 9.4 mg/dL (ref 8.4–10.5)
Chloride: 105 meq/L (ref 96–112)
Creatinine, Ser: 1 mg/dL (ref 0.40–1.50)
GFR: 87.05 mL/min (ref >60.00)
Glucose, Bld: 107 mg/dL — ABNORMAL HIGH (ref 70–99)
Potassium: 4.2 meq/L (ref 3.5–5.1)
Sodium: 138 meq/L (ref 135–145)
Total Bilirubin: 0.7 mg/dL (ref 0.2–1.2)
Total Protein: 7.2 g/dL (ref 6.0–8.3)

## 2024-05-17 LAB — CBC WITH DIFFERENTIAL/PLATELET
Basophils Absolute: 0 10*3/uL (ref 0.0–0.1)
Basophils Relative: 0.5 % (ref 0.0–3.0)
Eosinophils Absolute: 0.2 10*3/uL (ref 0.0–0.7)
Eosinophils Relative: 3.3 % (ref 0.0–5.0)
HCT: 46.9 % (ref 39.0–52.0)
Hemoglobin: 15.9 g/dL (ref 13.0–17.0)
Lymphocytes Relative: 27.1 % (ref 12.0–46.0)
Lymphs Abs: 1.9 10*3/uL (ref 0.7–4.0)
MCHC: 33.9 g/dL (ref 30.0–36.0)
MCV: 88.5 fl (ref 78.0–100.0)
Monocytes Absolute: 0.6 10*3/uL (ref 0.1–1.0)
Monocytes Relative: 8.4 % (ref 3.0–12.0)
Neutro Abs: 4.1 10*3/uL (ref 1.4–7.7)
Neutrophils Relative %: 60.7 % (ref 43.0–77.0)
Platelets: 216 10*3/uL (ref 150.0–400.0)
RBC: 5.3 Mil/uL (ref 4.22–5.81)
RDW: 13.7 % (ref 11.5–15.5)
WBC: 6.8 10*3/uL (ref 4.0–10.5)

## 2024-05-17 LAB — LIPID PANEL
Cholesterol: 280 mg/dL — ABNORMAL HIGH (ref 0–200)
HDL: 50 mg/dL (ref >39.00)
LDL Cholesterol: 159 mg/dL — ABNORMAL HIGH (ref 0–99)
NonHDL: 230.48
Total CHOL/HDL Ratio: 6
Triglycerides: 355 mg/dL — ABNORMAL HIGH (ref 0.0–149.0)
VLDL: 71 mg/dL — ABNORMAL HIGH (ref 0.0–40.0)

## 2024-05-17 LAB — URINALYSIS, ROUTINE W REFLEX MICROSCOPIC
Bilirubin Urine: NEGATIVE
Hgb urine dipstick: NEGATIVE
Ketones, ur: NEGATIVE
Nitrite: NEGATIVE
RBC / HPF: NONE SEEN (ref 0–?)
Specific Gravity, Urine: >=1.03 — AB (ref 1.000–1.030)
Total Protein, Urine: NEGATIVE
Urine Glucose: NEGATIVE
Urobilinogen, UA: 0.2 (ref 0.0–1.0)
pH: 6 (ref 5.0–8.0)

## 2024-05-17 LAB — HEMOGLOBIN A1C: Hgb A1c MFr Bld: 5.5 % (ref 4.6–6.5)

## 2024-05-17 NOTE — Progress Notes (Addendum)
 Established Patient Office Visit   Subjective:  Patient ID: Samuel Bridges, male    DOB: 29-Jan-1971  Age: 53 y.o. MRN: [id number]  Chief Complaint  Patient presents with   Annual Exam    Pt is fasting. Will receive tdap and shingrix  vaccines today.     HPI Encounter Diagnoses  Name Primary?   Healthcare maintenance Yes   Screening for prostate cancer    Screening for cholesterol level    Screening for diabetes mellitus    Immunization due    Hepatic steatosis    Encounter for hepatitis C screening test for low risk patient    Screening for HIV (human immunodeficiency virus)    Hypertriglyceridemia    Screening for colon cancer    Overweight (BMI 25.0-29.9)    For physical and follow-up of above.  Continues to do well.  Had lost weight but then gained it back.  He is motivated to increase his exercise and lose weight again.  He understands that this is the treatment for steatosis.  He does have regular dental care.   Review of Systems  Constitutional: Negative.   HENT: Negative.    Eyes:  Negative for blurred vision, discharge and redness.  Respiratory: Negative.    Cardiovascular: Negative.   Gastrointestinal:  Negative for abdominal pain.  Genitourinary: Negative.   Musculoskeletal: Negative.  Negative for myalgias.  Skin:  Negative for rash.  Neurological:  Negative for tingling, loss of consciousness and weakness.  Endo/Heme/Allergies:  Negative for polydipsia.      05/17/2024    9:41 AM 05/04/2024   11:35 AM 01/07/2023   11:20 AM  Depression screen PHQ 2/9  Decreased Interest 0 0 0  Down, Depressed, Hopeless 0 0 0  PHQ - 2 Score 0 0 0  Altered sleeping 0  0  Tired, decreased energy 2  1  Change in appetite 0  0  Feeling bad or failure about yourself  0  0  Trouble concentrating 0  0  Moving slowly or fidgety/restless 0  0  Suicidal thoughts 0  0  PHQ-9 Score 2  1  Difficult doing work/chores Not difficult at all  Not difficult at all      Current  Outpatient Medications:    albuterol (VENTOLIN HFA) 108 (90 Base) MCG/ACT inhaler, Inhale 2 puffs into the lungs every 4 (four) hours as needed., Disp: , Rfl:    icosapent  Ethyl (VASCEPA ) 1 g capsule, Take 2 capsules (2 g total) by mouth 2 (two) times daily., Disp: 120 capsule, Rfl: 12   Objective:     BP 110/80 (Cuff Size: Large)   Pulse 75   Temp (!) 97.3 F (36.3 C) (Temporal)   Ht 6' 3 (1.905 m)   Wt 228 lb 12.8 oz (103.8 kg)   SpO2 97%   BMI 28.60 kg/m  Wt Readings from Last 3 Encounters:  05/17/24 228 lb 12.8 oz (103.8 kg)  05/04/24 226 lb 12.8 oz (102.9 kg)  01/07/23 224 lb 12.8 oz (102 kg)      Physical Exam Constitutional:      General: He is not in acute distress.    Appearance: Normal appearance. He is not ill-appearing, toxic-appearing or diaphoretic.  HENT:     Head: Normocephalic and atraumatic.     Right Ear: Tympanic membrane, ear canal and external ear normal.     Left Ear: Tympanic membrane, ear canal and external ear normal.     Mouth/Throat:     Mouth:  Mucous membranes are moist.     Pharynx: Oropharynx is clear. No oropharyngeal exudate or posterior oropharyngeal erythema.   Eyes:     General: No scleral icterus.       Right eye: No discharge.        Left eye: No discharge.     Extraocular Movements: Extraocular movements intact.     Conjunctiva/sclera: Conjunctivae normal.     Pupils: Pupils are equal, round, and reactive to light.    Cardiovascular:     Rate and Rhythm: Normal rate and regular rhythm.  Pulmonary:     Effort: Pulmonary effort is normal. No respiratory distress.     Breath sounds: Normal breath sounds. No wheezing, rhonchi or rales.  Abdominal:     General: Bowel sounds are normal.     Tenderness: There is no abdominal tenderness. There is no guarding or rebound.     Hernia: There is no hernia in the left inguinal area or right inguinal area.  Genitourinary:    Penis: Circumcised. No hypospadias, erythema, tenderness,  discharge, swelling or lesions.      Testes:        Right: Mass, tenderness or swelling not present. Right testis is descended.        Left: Mass, tenderness or swelling not present. Left testis is descended.     Epididymis:     Right: Not inflamed or enlarged. No mass.     Left: Not inflamed or enlarged. No mass.   Musculoskeletal:     Cervical back: No rigidity or tenderness.  Lymphadenopathy:     Lower Body: No right inguinal adenopathy. No left inguinal adenopathy.   Skin:    General: Skin is warm and dry.   Neurological:     Mental Status: He is alert and oriented to person, place, and time.   Psychiatric:        Mood and Affect: Mood normal.        Behavior: Behavior normal.      Results for orders placed or performed in visit on 05/17/24  CBC with Differential/Platelet  Result Value Ref Range   WBC 6.8 4.0 - 10.5 K/uL   RBC 5.30 4.22 - 5.81 Mil/uL   Hemoglobin 15.9 13.0 - 17.0 g/dL   HCT 53.0 60.9 - 47.9 %   MCV 88.5 78.0 - 100.0 fl   MCHC 33.9 30.0 - 36.0 g/dL   RDW 86.2 88.4 - 84.4 %   Platelets 216.0 150.0 - 400.0 K/uL   Neutrophils Relative % 60.7 43.0 - 77.0 %   Lymphocytes Relative 27.1 12.0 - 46.0 %   Monocytes Relative 8.4 3.0 - 12.0 %   Eosinophils Relative 3.3 0.0 - 5.0 %   Basophils Relative 0.5 0.0 - 3.0 %   Neutro Abs 4.1 1.4 - 7.7 K/uL   Lymphs Abs 1.9 0.7 - 4.0 K/uL   Monocytes Absolute 0.6 0.1 - 1.0 K/uL   Eosinophils Absolute 0.2 0.0 - 0.7 K/uL   Basophils Absolute 0.0 0.0 - 0.1 K/uL  Comprehensive metabolic panel with GFR  Result Value Ref Range   Sodium 138 135 - 145 mEq/L   Potassium 4.2 3.5 - 5.1 mEq/L   Chloride 105 96 - 112 mEq/L   CO2 25 19 - 32 mEq/L   Glucose, Bld 107 (H) 70 - 99 mg/dL   BUN 14 6 - 23 mg/dL   Creatinine, Ser 8.99 0.40 - 1.50 mg/dL   Total Bilirubin 0.7 0.2 - 1.2 mg/dL   Alkaline  Phosphatase 132 (H) 39 - 117 U/L   AST 42 (H) 0 - 37 U/L   ALT 109 (H) 0 - 53 U/L   Total Protein 7.2 6.0 - 8.3 g/dL   Albumin 4.4  3.5 - 5.2 g/dL   GFR 12.94 >39.99 mL/min   Calcium 9.4 8.4 - 10.5 mg/dL  Lipid panel  Result Value Ref Range   Cholesterol 280 (H) 0 - 200 mg/dL   Triglycerides 644.9 (H) 0.0 - 149.0 mg/dL   HDL 49.99 >60.99 mg/dL   VLDL 28.9 (H) 0.0 - 59.9 mg/dL   LDL Cholesterol 840 (H) 0 - 99 mg/dL   Total CHOL/HDL Ratio 6    NonHDL 230.48   Urinalysis, Routine w reflex microscopic  Result Value Ref Range   Color, Urine YELLOW Yellow;Lt. Yellow;Straw;Dark Yellow;Amber;Green;Red;Brown   APPearance CLEAR Clear;Turbid;Slightly Cloudy;Cloudy   Specific Gravity, Urine >=1.030 (A) 1.000 - 1.030   pH 6.0 5.0 - 8.0   Total Protein, Urine NEGATIVE Negative   Urine Glucose NEGATIVE Negative   Ketones, ur NEGATIVE Negative   Bilirubin Urine NEGATIVE Negative   Hgb urine dipstick NEGATIVE Negative   Urobilinogen, UA 0.2 0.0 - 1.0   Leukocytes,Ua TRACE (A) Negative   Nitrite NEGATIVE Negative   WBC, UA 7-10/hpf (A) 0-2/hpf   RBC / HPF none seen 0-2/hpf   Mucus, UA Presence of (A) None  Hemoglobin A1c  Result Value Ref Range   Hgb A1c MFr Bld 5.5 4.6 - 6.5 %  Hepatitis C antibody  Result Value Ref Range   Hepatitis C Ab NON-REACTIVE NON-REACTIVE  HIV Antibody (routine testing w rflx)  Result Value Ref Range   HIV 1&2 Ab, 4th Generation NON-REACTIVE NON-REACTIVE  PSA  Result Value Ref Range   PSA 0.87 < OR = 4.00 ng/mL      The 10-year ASCVD risk score (Arnett DK, et al., 2019) is: 4.6%    Assessment & Plan:   Healthcare maintenance -     CBC with Differential/Platelet -     Urinalysis, Routine w reflex microscopic  Screening for prostate cancer -     PSA  Screening for cholesterol level -     Comprehensive metabolic panel with GFR -     Lipid panel  Screening for diabetes mellitus -     Comprehensive metabolic panel with GFR -     Hemoglobin A1c  Immunization due -     Tdap vaccine greater than or equal to 7yo IM -     Varicella-zoster vaccine IM -     Varicella-zoster vaccine  subcutaneous; Future  Hepatic steatosis -     Comprehensive metabolic panel with GFR  Encounter for hepatitis C screening test for low risk patient -     Hepatitis C antibody  Screening for HIV (human immunodeficiency virus) -     HIV Antibody (routine testing w rflx)  Hypertriglyceridemia  Screening for colon cancer -     Ambulatory referral to Gastroenterology  Overweight (BMI 25.0-29.9)    Return in about 1 year (around 05/17/2025).  Information was given on health maintenance and disease prevention.  Encouraged him to continue his healthy active lifestyle.  He understands that the main treatments for steatosis is weight loss low-fat low-cholesterol diet.  Information was given.  Tdap and first Shingrix  today.  Will return in 2 to 6 months for his second.  He will be following up with GI for his colonoscopy.  Elsie Sim Lent, MD  6/26 addendum: Sleep but not  approved.  Patient will lower the fat and cholesterol in his diet and try to lose some weight.  Recommended 1 g of fish oil twice daily.

## 2024-05-18 ENCOUNTER — Other Ambulatory Visit (HOSPITAL_COMMUNITY): Payer: Self-pay

## 2024-05-18 ENCOUNTER — Ambulatory Visit: Payer: Self-pay | Admitting: Family Medicine

## 2024-05-18 LAB — HEPATITIS C ANTIBODY: Hepatitis C Ab: NONREACTIVE

## 2024-05-18 LAB — HIV ANTIBODY (ROUTINE TESTING W REFLEX): HIV 1&2 Ab, 4th Generation: NONREACTIVE

## 2024-05-18 LAB — PSA: PSA: 0.87 ng/mL (ref <=4.00)

## 2024-05-18 MED ORDER — ICOSAPENT ETHYL 1 G PO CAPS: 2.0000 g | ORAL_CAPSULE | Freq: Two times a day (BID) | ORAL | 3 refills | Status: DC

## 2024-05-18 NOTE — Addendum Note (Signed)
 Addended by: Delene Feinstein on: 05/18/2024 07:09 AM   Modules accepted: Orders

## 2024-05-21 ENCOUNTER — Telehealth: Payer: Self-pay

## 2024-05-21 ENCOUNTER — Other Ambulatory Visit (HOSPITAL_COMMUNITY): Payer: Self-pay

## 2024-05-21 DIAGNOSIS — E781 Pure hyperglyceridemia: Secondary | ICD-10-CM

## 2024-05-21 NOTE — Telephone Encounter (Signed)
 Pharmacy Patient Advocate Encounter  Insurance verification completed.   The patient is insured through [employer]   Ran test claim for Icosapent  Ethyl 1GM capsules IS NOT COVER PLAN WILL ONLY COVER THE BRAND(VASCEPA ) 1 g capsule   Currently a quantity of 360 is a 90 day supply and the co-pay is 300.00 . OR 30 day co-pay is, $100.00.  No PA needed at this time.  SEE BELOW TEST CLAIMS  TEST CLAIM was processed through  [facility]- copay amounts may vary at other pharmacies due to pharmacy/plan contracts, or as the patient moves through the different stages of their insurance plan.

## 2024-05-24 MED ORDER — ICOSAPENT ETHYL 1 G PO CAPS: 2.0000 g | ORAL_CAPSULE | Freq: Two times a day (BID) | ORAL | 12 refills | Status: DC

## 2024-06-03 NOTE — Addendum Note (Signed)
 Addended by: BERNETA ELSIE LABOR on: 06/03/2024 05:16 PM   Modules accepted: Orders

## 2024-06-21 ENCOUNTER — Other Ambulatory Visit (HOSPITAL_COMMUNITY): Payer: Self-pay

## 2024-07-07 ENCOUNTER — Other Ambulatory Visit (HOSPITAL_COMMUNITY): Payer: Self-pay

## 2024-07-07 ENCOUNTER — Encounter: Payer: Self-pay | Admitting: Gastroenterology

## 2024-07-14 ENCOUNTER — Other Ambulatory Visit: Payer: Self-pay | Admitting: Medical Genetics

## 2024-07-20 ENCOUNTER — Encounter: Payer: Self-pay | Admitting: Gastroenterology

## 2024-07-20 ENCOUNTER — Ambulatory Visit (AMBULATORY_SURGERY_CENTER)

## 2024-07-20 VITALS — Ht 75.0 in | Wt 218.0 lb

## 2024-07-20 DIAGNOSIS — Z1211 Encounter for screening for malignant neoplasm of colon: Secondary | ICD-10-CM

## 2024-07-20 MED ORDER — NA SULFATE-K SULFATE-MG SULF 17.5-3.13-1.6 GM/177ML PO SOLN
1.0000 | Freq: Once | ORAL | 0 refills | Status: AC
Start: 2024-07-20 — End: 2024-07-20

## 2024-07-20 NOTE — Progress Notes (Signed)
 No egg or soy allergy known to patient  No issues known to pt with past sedation with any surgeries or procedures Patient denies ever being told they had issues or difficulty with intubation  No FH of Malignant Hyperthermia Pt is not on diet pills nor GLP-1 medications Pt is not on  home 02  Pt is not on blood thinners  Pt denies issues with constipation  No A fib or A flutter Have any cardiac testing pending--no Pt instructed to use Singlecare.com or GoodRx for a price reduction on prep  Ambulates independently

## 2024-08-08 ENCOUNTER — Encounter: Payer: Self-pay | Admitting: Gastroenterology

## 2024-08-10 ENCOUNTER — Ambulatory Visit: Admitting: Gastroenterology

## 2024-08-10 ENCOUNTER — Encounter: Payer: Self-pay | Admitting: Gastroenterology

## 2024-08-10 VITALS — BP 98/66 | HR 60 | Temp 97.8°F | Resp 12 | Ht 75.0 in | Wt 218.0 lb

## 2024-08-10 DIAGNOSIS — K635 Polyp of colon: Secondary | ICD-10-CM

## 2024-08-10 DIAGNOSIS — Z1211 Encounter for screening for malignant neoplasm of colon: Secondary | ICD-10-CM

## 2024-08-10 DIAGNOSIS — K5730 Diverticulosis of large intestine without perforation or abscess without bleeding: Secondary | ICD-10-CM | POA: Diagnosis not present

## 2024-08-10 DIAGNOSIS — K641 Second degree hemorrhoids: Secondary | ICD-10-CM

## 2024-08-10 DIAGNOSIS — D124 Benign neoplasm of descending colon: Secondary | ICD-10-CM

## 2024-08-10 DIAGNOSIS — K648 Other hemorrhoids: Secondary | ICD-10-CM | POA: Diagnosis not present

## 2024-08-10 MED ORDER — SODIUM CHLORIDE 0.9 % IV SOLN: 500.0000 mL | Freq: Once | INTRAVENOUS | Status: DC

## 2024-08-10 NOTE — Patient Instructions (Signed)

## 2024-08-10 NOTE — Progress Notes (Signed)
 Called to room to assist during endoscopic procedure.  Patient ID and intended procedure confirmed with present staff. Received instructions for my participation in the procedure from the performing physician.

## 2024-08-10 NOTE — Progress Notes (Signed)
 Pt's states no medical or surgical changes since previsit or office visit.

## 2024-08-10 NOTE — Progress Notes (Signed)
 Report given to PACU, vss

## 2024-08-10 NOTE — Progress Notes (Signed)
 GASTROENTEROLOGY PROCEDURE H&P NOTE   Primary Care Physician: Berneta Elsie Sayre, MD    Reason for Procedure:  Colon Cancer screening  Plan:    Colonoscopy  Patient is appropriate for endoscopic procedure(s) in the ambulatory ([facility]) setting.  The nature of the procedure, as well as the risks, benefits, and alternatives were carefully and thoroughly reviewed with the patient. Ample time for discussion and questions allowed. The patient understood, was satisfied, and agreed to proceed.     HPI: Samuel Bridges is a 53 y.o. male who presents for colonoscopy for routine Colon Cancer screening.  No active GI symptoms.  Father and sister with colon polyps, but no known family history of colon cancer or related malignancy.  Patient is otherwise without complaints or active issues today.  Past Medical History:  Diagnosis Date   Allergy    Chicken pox    History of shingles    Hypertriglyceridemia    Skin lesions, generalized    close surveillance with family history of melanoma   Vaso vagal episode    had full cardiology evaluation for exertional symptoms: negative Echo, stress-test    Past Surgical History:  Procedure Laterality Date   APPENDECTOMY     '97   CHOLECYSTECTOMY     '10 acute; laproscopic   EYE SURGERY     correction of stabismus - adjustable suture technique   SHOULDER ARTHROSCOPY W/ ROTATOR CUFF REPAIR     right shoulder  2010    Prior to Admission medications   Not on File    No current outpatient medications on file.   Current Facility-Administered Medications  Medication Dose Route Frequency Provider Last Rate Last Admin   0.9 %  sodium chloride  infusion  500 mL Intravenous Once Brenisha Tsui V, DO        Allergies as of 08/10/2024   (No Known Allergies)    Family History  Problem Relation Age of Onset   Cancer Mother        lung cancer   Hypertension Mother    Colon polyps Father    Cancer Father        lung   Basal cell carcinoma  Father        aggressive - required radical neck dissection   Colon polyps Sister    Melanoma Sister    Basal cell carcinoma Sister    Cancer Paternal Grandfather        lung cancer   Cancer Other        lung cancer   Diabetes Neg Hx    Early death Neg Hx    COPD Neg Hx    Colon cancer Neg Hx    Rectal cancer Neg Hx    Stomach cancer Neg Hx    Esophageal cancer Neg Hx     Social History   Socioeconomic History   Marital status: Married    Spouse name: Not on file   Number of children: 3   Years of education: 16   Highest education level: Bachelor's degree (e.g., BA, AB, BS)  Occupational History   Occupation: businessman    Comment: Gen' mgr for [employer]  Tobacco Use   Smoking status: Never   Smokeless tobacco: Never  Vaping Use   Vaping status: Never Used  Substance and Sexual Activity   Alcohol use: Yes    Comment: rare   Drug use: No   Sexual activity: Yes    Partners: Female  Other Topics Concern  Not on file  Social History Narrative   [employer] BS acctg; Married '02.  2 sons - '03, '06; 1 dtr - '09. Work - general mgr for [employer]. No history of abuse. Marriage is in excellent health. 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; physical training   Social Drivers of Health   Financial Resource Strain: Low Risk  (05/16/2024)   Overall Financial Resource Strain (CARDIA)    Difficulty of Paying Living Expenses: Not hard at all  Food Insecurity: No Food Insecurity (05/16/2024)   Hunger Vital Sign    Worried About Running Out of Food in the Last Year: Never true    Ran Out of Food in the Last Year: Never true  Transportation Needs: No Transportation Needs (05/16/2024)   PRAPARE - Administrator, Civil Service (Medical): No    Lack of Transportation (Non-Medical): No  Physical Activity: Insufficiently Active (05/16/2024)   Exercise Vital Sign    Days of Exercise per Week: 1 day    Minutes of Exercise per Session: 20 min   Stress: No Stress Concern Present (05/16/2024)   [employer] of Occupational Health - Occupational Stress Questionnaire    Feeling of Stress : Only a little  Social Connections: Socially Integrated (05/16/2024)   Social Connection and Isolation Panel    Frequency of Communication with Friends and Family: More than three times a week    Frequency of Social Gatherings with Friends and Family: More than three times a week    Attends Religious Services: More than 4 times per year    Active Member of [employer] or Organizations: Yes    Attends Engineer, structural: More than 4 times per year    Marital Status: Married  Catering manager Violence: Not on file    Physical Exam: Vital signs in last 24 hours: @BP  122/88   Pulse 68   Temp 97.8 F (36.6 C) (Temporal)   Ht 6' 3 (1.905 m)   Wt 218 lb (98.9 kg)   SpO2 97%   BMI 27.25 kg/m  GEN: NAD EYE: Sclerae anicteric ENT: MMM CV: Non-tachycardic Pulm: CTA b/l GI: Soft, NT/ND NEURO:  Alert & Oriented x 3   Sandor Flatter, DO  Gastroenterology   08/10/2024 7:59 AM

## 2024-08-10 NOTE — Op Note (Signed)
 [facility] Patient Name: Samuel Bridges Procedure Date: 08/10/2024 8:01 AM MRN: [id number] Endoscopist: Sandor Flatter , MD, [id number] Age: 53 Referring MD:  Date of Birth: 1971/03/10 Gender: Male Account #: [id number] Procedure:                Colonoscopy Indications:              Screening for colorectal malignant neoplasm, This                            is the patient's first colonoscopy Medicines:                Monitored Anesthesia Care Procedure:                Pre-Anesthesia Assessment:                           - Prior to the procedure, a History and Physical                            was performed, and patient medications and                            allergies were reviewed. The patient's tolerance of                            previous anesthesia was also reviewed. The risks                            and benefits of the procedure and the sedation                            options and risks were discussed with the patient.                            All questions were answered, and informed consent                            was obtained. Prior Anticoagulants: The patient has                            taken no anticoagulant or antiplatelet agents. ASA                            Grade Assessment: II - A patient with mild systemic                            disease. After reviewing the risks and benefits,                            the patient was deemed in satisfactory condition to                            undergo the procedure.  After obtaining informed consent, the colonoscope                            was passed under direct vision. Throughout the                            procedure, the patient's blood pressure, pulse, and                            oxygen saturations were monitored continuously. The                            CF [id number] #[id number] was introduced through the anus                            and advanced to the the  terminal ileum. The                            colonoscopy was performed without difficulty. The                            patient tolerated the procedure well. The quality                            of the bowel preparation was excellent. The                            terminal ileum, ileocecal valve, appendiceal                            orifice, and rectum were photographed. Scope In: 8:05:21 AM Scope Out: 8:20:52 AM Scope Withdrawal Time: 0 hours 11 minutes 39 seconds  Total Procedure Duration: 0 hours 15 minutes 31 seconds  Findings:                 The perianal and digital rectal examinations were                            normal.                           A 3 mm polyp was found in the descending colon. The                            polyp was sessile. The polyp was removed with a                            cold snare. Resection and retrieval were complete.                            Estimated blood loss was minimal.                           A few small-mouthed diverticula were found in the  sigmoid colon.                           Non-bleeding internal hemorrhoids were found during                            retroflexion. The hemorrhoids were small.                           The terminal ileum appeared normal. Complications:            No immediate complications. Estimated Blood Loss:     Estimated blood loss was minimal. Impression:               - One 3 mm polyp in the descending colon, removed                            with a cold snare. Resected and retrieved.                           - Diverticulosis in the sigmoid colon.                           - Non-bleeding internal hemorrhoids.                           - The examined portion of the ileum was normal. Recommendation:           - Patient has a contact number available for                            emergencies. The signs and symptoms of potential                            delayed  complications were discussed with the                            patient. Return to normal activities tomorrow.                            Written discharge instructions were provided to the                            patient.                           - Resume previous diet.                           - Continue present medications.                           - Await pathology results.                           - Repeat colonoscopy for surveillance based on  pathology results.                           - Return to GI office PRN. Sandor Flatter, MD 08/10/2024 8:24:41 AM

## 2024-08-11 ENCOUNTER — Telehealth: Payer: Self-pay | Admitting: *Deleted

## 2024-08-11 NOTE — Telephone Encounter (Signed)
  Follow up Call-     08/10/2024    7:26 AM  Call back number  Post procedure Call Back phone  # [phone]  Permission to leave phone message Yes     Patient questions:  Do you have a fever, pain , or abdominal swelling? No. Pain Score  0 *  Have you tolerated food without any problems? Yes.    Have you been able to return to your normal activities? Yes.    Do you have any questions about your discharge instructions: Diet   No. Medications  No. Follow up visit  No.  Do you have questions or concerns about your Care? No.  Actions: * If pain score is 4 or above: No action needed, pain <4.

## 2024-08-12 ENCOUNTER — Ambulatory Visit: Payer: Self-pay | Admitting: Gastroenterology

## 2024-08-12 LAB — SURGICAL PATHOLOGY

## 2024-10-11 ENCOUNTER — Other Ambulatory Visit: Payer: Self-pay | Admitting: Medical Genetics

## 2024-10-11 DIAGNOSIS — Z006 Encounter for examination for normal comparison and control in clinical research program: Secondary | ICD-10-CM

## 2024-11-18 ENCOUNTER — Ambulatory Visit: Admitting: Family Medicine

## 2024-11-23 ENCOUNTER — Ambulatory Visit

## 2024-12-10 ENCOUNTER — Ambulatory Visit: Admitting: Family Medicine

## 2024-12-10 ENCOUNTER — Encounter: Payer: Self-pay | Admitting: Family Medicine

## 2024-12-10 VITALS — BP 110/66 | HR 75 | Temp 98.6°F | Ht 75.0 in | Wt 222.8 lb

## 2024-12-10 DIAGNOSIS — R7989 Other specified abnormal findings of blood chemistry: Secondary | ICD-10-CM

## 2024-12-10 DIAGNOSIS — K760 Fatty (change of) liver, not elsewhere classified: Secondary | ICD-10-CM | POA: Diagnosis not present

## 2024-12-10 DIAGNOSIS — E781 Pure hyperglyceridemia: Secondary | ICD-10-CM

## 2024-12-10 DIAGNOSIS — F341 Dysthymic disorder: Secondary | ICD-10-CM | POA: Diagnosis not present

## 2024-12-10 DIAGNOSIS — F419 Anxiety disorder, unspecified: Secondary | ICD-10-CM | POA: Diagnosis not present

## 2024-12-10 DIAGNOSIS — Z23 Encounter for immunization: Secondary | ICD-10-CM | POA: Diagnosis not present

## 2024-12-10 NOTE — Progress Notes (Signed)
 "  Established Patient Office Visit   Subjective:  Patient ID: Samuel Bridges, male    DOB: 08/08/1971  Age: 54 y.o. MRN: [id number]  Chief Complaint  Patient presents with   Follow-up    Want meds for anxiety, tiredness    HPI Encounter Diagnoses  Name Primary?   Immunization due Yes   Dysthymia    Anxiety    Hypertriglyceridemia    Hepatic steatosis    Elevated LFTs    Presents with a 4 to 6-week history of increased anxiety with difficulty focusing.  He is disappointed with himself for not being able to manage this.  He denies new stresses in his life.  Does not feel that the holidays have been particularly stressful.  His mother-in-law just had a CVA but is back at home.  He is concerned that she may be dealing with cognitive decline.  He has been supportive of his wife.  He has curtailed his exercise program that has helped him manage these symptoms in the past.  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  He does speak with his pastor and attends church.  He never started the Vascepa .  He has worked on losing weight and lowered the fat and cholesterol in his diet.  His mother had a high triglycerides as well.   Review of Systems  Constitutional: Negative.   HENT: Negative.    Eyes:  Negative for blurred vision, discharge and redness.  Respiratory: Negative.    Cardiovascular: Negative.   Gastrointestinal:  Negative for abdominal pain.  Genitourinary: Negative.   Musculoskeletal: Negative.  Negative for myalgias.  Skin:  Negative for rash.  Neurological:  Negative for tingling, loss of consciousness and weakness.  Endo/Heme/Allergies:  Negative for polydipsia.      12/10/2024   10:31 AM 05/17/2024    9:41 AM 05/04/2024   11:35 AM  Depression screen PHQ 2/9  Decreased Interest 2 0 0  Down, Depressed, Hopeless 0 0 0  PHQ - 2 Score 2 0 0  Altered sleeping 2 0   Tired, decreased energy 3 2   Change in appetite 0 0   Feeling bad or failure about yourself  0 0   Trouble  concentrating 3 0   Moving slowly or fidgety/restless 3 0   Suicidal thoughts 0 0   PHQ-9 Score 13 2    Difficult doing work/chores Extremely dIfficult Not difficult at all      Data saved with a previous flowsheet row definition      Current Medications[1]   Objective:     BP 110/66   Pulse 75   Temp 98.6 F (37 C) (Temporal)   Ht 6' 3 (1.905 m)   Wt 222 lb 12.8 oz (101.1 kg)   SpO2 98%   BMI 27.85 kg/m    Physical Exam Constitutional:      General: He is not in acute distress.    Appearance: Normal appearance. He is not ill-appearing, toxic-appearing or diaphoretic.  HENT:     Head: Normocephalic and atraumatic.     Right Ear: External ear normal.     Left Ear: External ear normal.  Eyes:     General: No scleral icterus.       Right eye: No discharge.        Left eye: No discharge.     Extraocular Movements: Extraocular movements intact.     Conjunctiva/sclera: Conjunctivae normal.  Pulmonary:     Effort: Pulmonary effort is normal. No  respiratory distress.  Skin:    General: Skin is warm and dry.  Neurological:     Mental Status: He is alert and oriented to person, place, and time.  Psychiatric:        Mood and Affect: Mood normal.        Behavior: Behavior normal.      No results found for any visits on 12/10/24.    The 10-year ASCVD risk score (Arnett DK, et al., 2019) is: 5%    Assessment & Plan:   Immunization due -     Varicella-zoster vaccine IM -     Heplisav-B (HepB-CPG) Vaccine  Dysthymia -     Ambulatory referral to Psychology  Anxiety -     Ambulatory referral to Psychology  Hypertriglyceridemia -     Lipid panel; Future -     Comprehensive metabolic panel with GFR; Future  Hepatic steatosis -     Comprehensive metabolic panel with GFR; Future  Elevated LFTs -     Comprehensive metabolic panel with GFR; Future    Return in about 6 months (around 06/09/2025), or Exercise for 30 minutes daily.  Return fasting for ordered  blood work in a few weeks.SABRA S[place] to go for talking therapy.  He will restart his exercise program that has helped so much in the past.  Information was given on exercising to stay healthy.  Recommended 30 minutes daily.  Will return in a few weeks for repeat fasting lipid profile LFT check.  Discussed the relationship between elevated LFTs and hepatic steatosis.  He understands that over time steatosis can advance to cirrhosis.  With his family history and hepatic steatosis will likely recommend that he go ahead and start the Vascepa .   Elsie Sim Lent, MD    [1] No current outpatient medications on file.  "

## 2024-12-17 ENCOUNTER — Ambulatory Visit: Admitting: Family Medicine

## 2025-01-05 ENCOUNTER — Other Ambulatory Visit

## 2025-01-13 ENCOUNTER — Ambulatory Visit: Admitting: Family Medicine

## 2025-05-19 ENCOUNTER — Encounter: Admitting: Family Medicine
